# Patient Record
Sex: Male | Born: 1965 | Race: White | Hispanic: No | Marital: Single | Smoking: Current every day smoker
Health system: Southern US, Community
[De-identification: ages and names within clinical notes are randomized; demographics above are authoritative.]

## PROBLEM LIST (undated history)

## (undated) HISTORY — PX: OTHER SURGICAL HISTORY: SHX169

---

## 2007-09-15 ENCOUNTER — Emergency Department: Payer: Self-pay | Admitting: Emergency Medicine

## 2009-02-10 ENCOUNTER — Emergency Department: Payer: Self-pay

## 2014-01-05 ENCOUNTER — Emergency Department: Payer: Self-pay | Admitting: Emergency Medicine

## 2014-03-19 ENCOUNTER — Observation Stay: Payer: Self-pay | Admitting: Surgery

## 2014-03-19 LAB — COMPREHENSIVE METABOLIC PANEL
ALBUMIN: 2.9 g/dL — AB (ref 3.4–5.0)
AST: 40 U/L — AB (ref 15–37)
Alkaline Phosphatase: 132 U/L — ABNORMAL HIGH
Anion Gap: 8 (ref 7–16)
BILIRUBIN TOTAL: 0.4 mg/dL (ref 0.2–1.0)
BUN: 7 mg/dL (ref 7–18)
CO2: 24 mmol/L (ref 21–32)
CREATININE: 0.78 mg/dL (ref 0.60–1.30)
Calcium, Total: 8.3 mg/dL — ABNORMAL LOW (ref 8.5–10.1)
Chloride: 102 mmol/L (ref 98–107)
EGFR (African American): >60
Glucose: 136 mg/dL — ABNORMAL HIGH (ref 65–99)
Osmolality: 268 (ref 275–301)
Potassium: 3.8 mmol/L (ref 3.5–5.1)
SGPT (ALT): 33 U/L (ref 12–78)
Sodium: 134 mmol/L — ABNORMAL LOW (ref 136–145)
Total Protein: 7 g/dL (ref 6.4–8.2)

## 2014-03-19 LAB — URINALYSIS, COMPLETE
BACTERIA: NONE SEEN
BILIRUBIN, UR: NEGATIVE
Blood: NEGATIVE
GLUCOSE, UR: NEGATIVE mg/dL (ref 0–75)
KETONE: NEGATIVE
LEUKOCYTE ESTERASE: NEGATIVE
Nitrite: NEGATIVE
Ph: 6 (ref 4.5–8.0)
Protein: NEGATIVE
RBC,UR: 2 /HPF (ref 0–5)
Specific Gravity: 1.01 (ref 1.003–1.030)
Squamous Epithelial: <1
WBC UR: 2 /HPF (ref 0–5)

## 2014-03-19 LAB — TROPONIN I: Troponin-I: <0.02 ng/mL

## 2014-03-19 LAB — CBC
HCT: 46.7 % (ref 40.0–52.0)
HGB: 15.6 g/dL (ref 13.0–18.0)
MCH: 33.1 pg (ref 26.0–34.0)
MCHC: 33.3 g/dL (ref 32.0–36.0)
MCV: 99 fL (ref 80–100)
Platelet: 184 10*3/uL (ref 150–440)
RBC: 4.7 10*6/uL (ref 4.40–5.90)
RDW: 13 % (ref 11.5–14.5)
WBC: 15.3 10*3/uL — AB (ref 3.8–10.6)

## 2014-03-19 LAB — CK TOTAL AND CKMB (NOT AT ARMC): CK, Total: 262 U/L

## 2014-03-21 ENCOUNTER — Emergency Department: Payer: Self-pay | Admitting: Emergency Medicine

## 2014-03-21 LAB — CBC WITH DIFFERENTIAL/PLATELET
Basophil #: 0.1 10*3/uL (ref 0.0–0.1)
Basophil %: 0.7 %
Eosinophil #: 0.3 10*3/uL (ref 0.0–0.7)
Eosinophil %: 3.2 %
HCT: 42.2 % (ref 40.0–52.0)
HGB: 14.3 g/dL (ref 13.0–18.0)
Lymphocyte #: 1 10*3/uL (ref 1.0–3.6)
Lymphocyte %: 12.3 %
MCH: 32.9 pg (ref 26.0–34.0)
MCHC: 33.8 g/dL (ref 32.0–36.0)
MCV: 98 fL (ref 80–100)
Monocyte #: 0.8 x10 3/mm (ref 0.2–1.0)
Monocyte %: 10.1 %
Neutrophil #: 6 10*3/uL (ref 1.4–6.5)
Neutrophil %: 73.7 %
Platelet: 166 10*3/uL (ref 150–440)
RBC: 4.33 10*6/uL — ABNORMAL LOW (ref 4.40–5.90)
RDW: 12.9 % (ref 11.5–14.5)
WBC: 8.2 10*3/uL (ref 3.8–10.6)

## 2014-03-21 LAB — COMPREHENSIVE METABOLIC PANEL
ALBUMIN: 2.8 g/dL — AB (ref 3.4–5.0)
ALK PHOS: 150 U/L — AB
ALT: 72 U/L (ref 12–78)
AST: 75 U/L — AB (ref 15–37)
Anion Gap: 7 (ref 7–16)
BUN: 8 mg/dL (ref 7–18)
Bilirubin,Total: 0.3 mg/dL (ref 0.2–1.0)
CO2: 27 mmol/L (ref 21–32)
Calcium, Total: 8.2 mg/dL — ABNORMAL LOW (ref 8.5–10.1)
Chloride: 101 mmol/L (ref 98–107)
Creatinine: 0.78 mg/dL (ref 0.60–1.30)
EGFR (African American): >60
EGFR (Non-African Amer.): >60
GLUCOSE: 114 mg/dL — AB (ref 65–99)
Osmolality: 269 (ref 275–301)
POTASSIUM: 3.2 mmol/L — AB (ref 3.5–5.1)
SODIUM: 135 mmol/L — AB (ref 136–145)
TOTAL PROTEIN: 6.6 g/dL (ref 6.4–8.2)

## 2014-03-21 LAB — TROPONIN I

## 2014-03-21 LAB — URINALYSIS, COMPLETE
Bacteria: NONE SEEN
Bilirubin,UR: NEGATIVE
Blood: NEGATIVE
Glucose,UR: NEGATIVE mg/dL (ref 0–75)
Ketone: NEGATIVE
Leukocyte Esterase: NEGATIVE
Nitrite: NEGATIVE
Ph: 7 (ref 4.5–8.0)
Protein: NEGATIVE
RBC,UR: 2 /HPF (ref 0–5)
Specific Gravity: 1.017 (ref 1.003–1.030)
Squamous Epithelial: <1
WBC UR: <1 /HPF (ref 0–5)

## 2014-03-21 LAB — LIPASE, BLOOD: Lipase: 167 U/L (ref 73–393)

## 2014-03-22 LAB — RAPID INFLUENZA A&B ANTIGENS

## 2014-03-23 LAB — URINE CULTURE

## 2014-03-26 LAB — CULTURE, BLOOD (SINGLE)

## 2014-12-05 ENCOUNTER — Emergency Department: Payer: Self-pay | Admitting: Emergency Medicine

## 2014-12-05 LAB — CBC
HCT: 50.1 % (ref 40.0–52.0)
HGB: 16.7 g/dL (ref 13.0–18.0)
MCH: 33.5 pg (ref 26.0–34.0)
MCHC: 33.4 g/dL (ref 32.0–36.0)
MCV: 100 fL (ref 80–100)
Platelet: 265 10*3/uL (ref 150–440)
RBC: 4.99 10*6/uL (ref 4.40–5.90)
RDW: 12.8 % (ref 11.5–14.5)
WBC: 10.1 10*3/uL (ref 3.8–10.6)

## 2014-12-05 LAB — BASIC METABOLIC PANEL
Anion Gap: 8 (ref 7–16)
BUN: 11 mg/dL (ref 7–18)
CHLORIDE: 105 mmol/L (ref 98–107)
Calcium, Total: 8.5 mg/dL (ref 8.5–10.1)
Co2: 27 mmol/L (ref 21–32)
Creatinine: 0.97 mg/dL (ref 0.60–1.30)
EGFR (African American): >60
EGFR (Non-African Amer.): >60
GLUCOSE: 110 mg/dL — AB (ref 65–99)
Osmolality: 279 (ref 275–301)
POTASSIUM: 4 mmol/L (ref 3.5–5.1)
Sodium: 140 mmol/L (ref 136–145)

## 2014-12-05 LAB — PROTIME-INR
INR: 0.9
PROTHROMBIN TIME: 12 s (ref 11.5–14.7)

## 2014-12-05 LAB — TROPONIN I

## 2014-12-06 LAB — TROPONIN I: Troponin-I: <0.02 ng/mL

## 2015-02-24 IMAGING — CR DG CHEST 2V
1 series · 2 of 2 positions shown · non-contrast
Comparison: DG CHEST 2V dated 03/19/2014

CLINICAL DATA: Abdominal pain.  Postoperative fever.

EXAM:
CHEST  2 VIEW

[Series 3: w chest pa · 0.14mm/px · 2 of 2 slices shown]
[im 1/2]
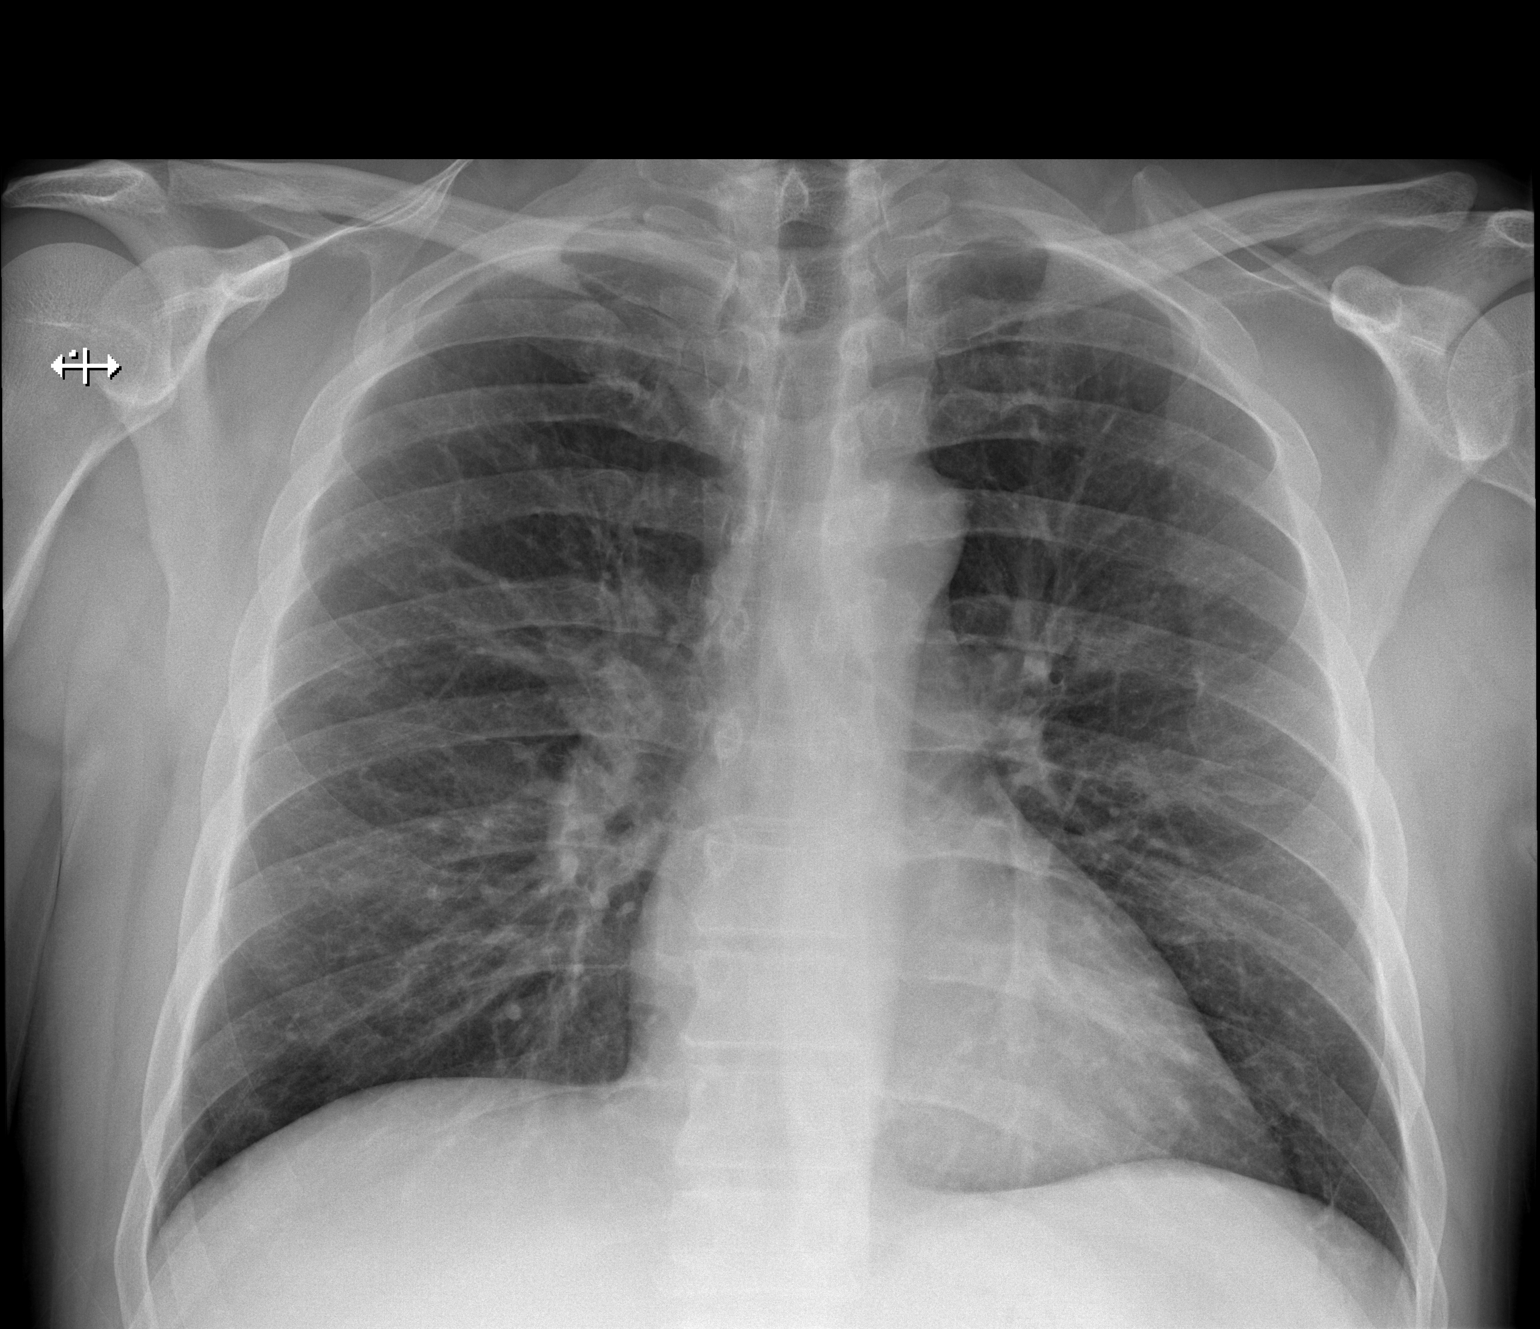
[im 2/2]
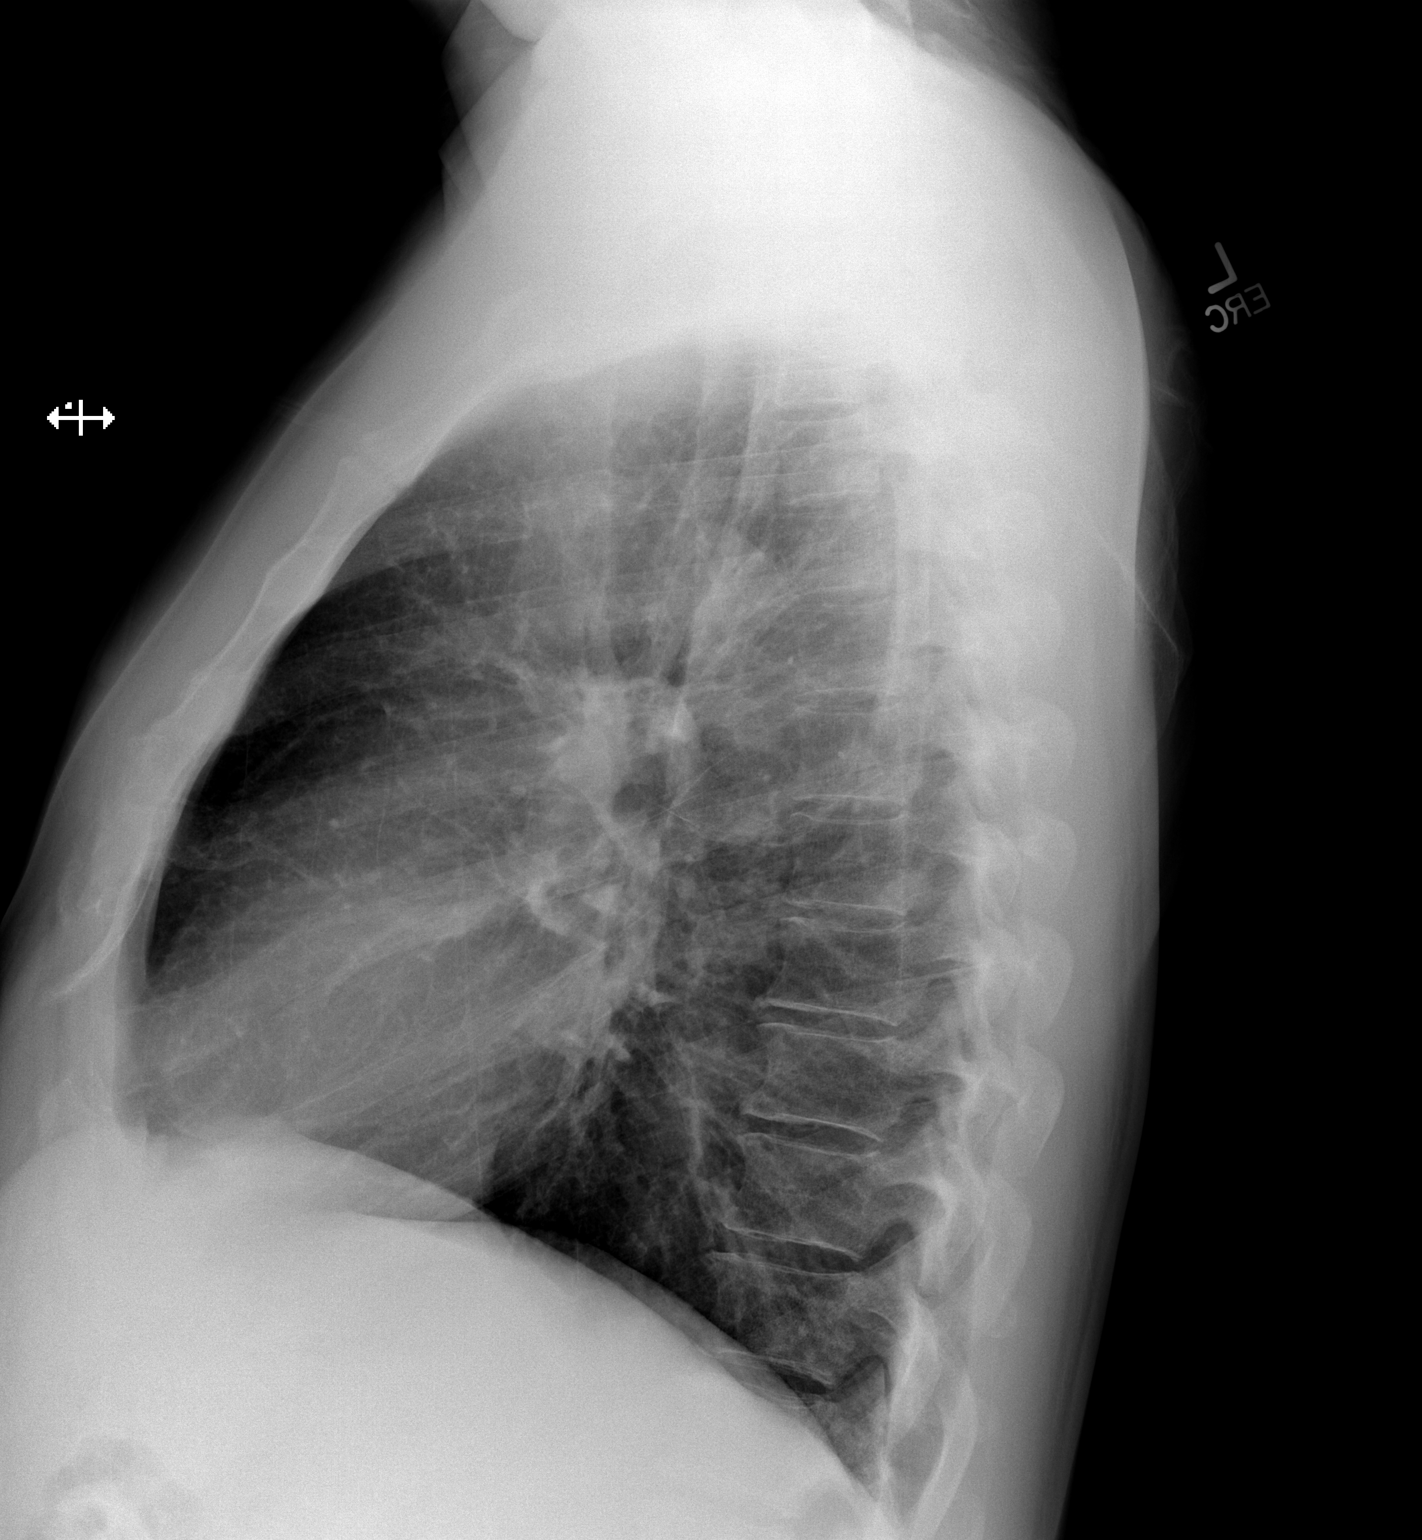

[2 of 2 positions shown; findings below may reference images not displayed]

FINDINGS: The heart size and mediastinal contours are within normal limits.
Both lungs are clear. The visualized skeletal structures are
unremarkable.
IMPRESSION: No active cardiopulmonary disease.

## 2015-03-26 NOTE — H&P (Signed)
History of Present Illness 49 yom who began experiencing anal pain 5 days ago, which is associated with constipation and is worsening. He now believes he needs to have a BM. He denies fever, and any previous similar episodes. He has not had any spontaneous drainage. Last meal was 4AM this AM. He has not been able to work this week.   Past Med/Surgical Hx:  Denies medical history:   ALLERGIES:  No Known Allergies:   HOME MEDICATIONS: Medication Instructions Status  Proctofoam 1% rectal foam  rectal 3 times a day Active   Family and Social History:  Family History Non-Contributory   Social History positive  tobacco, positive ETOH, lives with fiancee, her 63 and 71 yo children; works as a Development worker, international [employer], smokes 1 PPD, drinks moderate amount of ETOH (mostly beer), without clear binging history   Place of Living Home   Review of Systems:  Fever/Chills No   Cough No   Sputum No   Abdominal Pain No   Diarrhea No   Constipation Yes   Nausea/Vomiting No   SOB/DOE No   Chest Pain No   Dysuria No   Tolerating PT Yes   Tolerating Diet Yes   Medications/Allergies Reviewed Medications/Allergies reviewed   Physical Exam:  GEN well developed, well nourished, no acute distress   HEENT pink conjunctivae, PERRL, hearing intact to voice, dry oral mucosa, Oropharynx clear   NECK supple  No masses  trachea midline   RESP normal resp effort  clear BS  no use of accessory muscles   CARD regular rate  no murmur  No LE edema  no JVD  no Rub   ABD denies tenderness  soft  normal BS   GU posterior midline perianal redness, edema; much closer to anus than Pilonidal disease would be   LYMPH negative neck   EXTR negative cyanosis/clubbing, negative edema   SKIN normal to palpation, see above for rectal skin   NEURO cranial nerves intact, negative tremor, follows commands, motor/sensory function intact   PSYCH alert, A+O to time, place, person, good insight   Lab  Results: Hepatic:  17-Apr-15 14:32   Bilirubin, Total 0.4  Alkaline Phosphatase  132 (45-117 NOTE: New Reference Range 10/23/13)  SGPT (ALT) 33  SGOT (AST)  40  Total Protein, Serum 7.0  Albumin, Serum  2.9  Routine Chem:  17-Apr-15 14:32   Glucose, Serum  136  BUN 7  Creatinine (comp) 0.78  Sodium, Serum  134  Potassium, Serum 3.8  Chloride, Serum 102  CO2, Serum 24  Calcium (Total), Serum  8.3  Osmolality (calc) 268  eGFR (African American) >60  eGFR (Non-African American) >60 (eGFR values <9m/min/1.73 m2 may be an indication of chronic kidney disease (CKD). Calculated eGFR is useful in patients with stable renal function. The eGFR calculation will not be reliable in acutely ill patients when serum creatinine is changing rapidly. It is not useful in  patients on dialysis. The eGFR calculation may not be applicable to patients at the low and high extremes of body sizes, pregnant women, and vegetarians.)  Anion Gap 8  Cardiac:  17-Apr-15 14:32   CK, Total 262 (39-308 NOTE: NEW REFERENCE RANGE  01/04/2014)  CPK-MB, Serum  < 0.5 (Result(s) reported on 19 Mar 2014 at 03:23PM.)  Troponin I < 0.02 (0.00-0.05 0.05 ng/mL or less: NEGATIVE  Repeat testing in 3-6 hrs  if clinically indicated. >0.05 ng/mL: POTENTIAL  MYOCARDIAL INJURY. Repeat  testing in 3-6 hrs if  clinically indicated. NOTE:  An increase or decrease  of 30% or more on serial  testing suggests a  clinically important change)  Routine Hem:  17-Apr-15 14:32   WBC (CBC)  15.3  RBC (CBC) 4.70  Hemoglobin (CBC) 15.6  Hematocrit (CBC) 46.7  Platelet Count (CBC) 184 (Result(s) reported on 19 Mar 2014 at 03:14PM.)  MCV 99  MCH 33.1  MCHC 33.3  RDW 13.0   Radiology Results: LabUnknown:    17-Apr-15 16:37, CT Pelvis With Contrast  PACS Image  CT:  CT Pelvis With Contrast  REASON FOR EXAM:    eval rectal abscess. red, tender perineum;    NOTE:   Nursing to Give Oral CT Cont  COMMENTS:        PROCEDURE: CT  - CT PELVIS STANDARD W  - Mar 19 2014  4:37PM     CLINICAL DATA:  Clinical findings that may indicate a rectal abscess    EXAM:  CT PELVIS WITH CONTRAST    TECHNIQUE:  Multidetector CT imaging of the pelvis was performed using the  standard protocol following the bolus administration of intravenous  contrast.  CONTRAST:  100 cc of Isovue 370 intravenously. The patient also  received oral contrast material.    COMPARISON:  COMPARISON    None.    FINDINGS:  The visualized portions of the small and large bowel including the  appendix appear normal. The rectosigmoid is normal but there is a  small perirectal fluidcollection posteriorly demonstrated on images  50 through 58. This fluid collection measures 2.5 cm transversely x  2.6 cm AP x 2.8 cm longitudinally. It appears well marginated. No  significant inflammatory change in the ischiorectal fat is  demonstrated.  There is no free fluid or free air within the pelvis. There is no  pelvic sidewall lymphadenopathy. The urinary bladder, prostate  gland, and seminal vesicles appear normal. There is no inguinal  hernia. No significant inguinal lymphadenopathyis demonstrated. The  bony pelvis and the hips exhibit no acute abnormalities.     IMPRESSION:  1. There is a posterior perirectal/perianal fluid collection  consistent with the clinically suspected abscess. More proximally  the rectosigmoid appears normal.  2. There is no free fluid or free air within the pelvis nor in the  perirectal soft tissues.  3. There is no pelvic or inguinal lymphadenopathy.    Electronically Signed    By: David  Martinique    On: 03/19/2014 16:44         Verified By: DAVID A. Martinique, M.D., MD    Assessment/Admission Diagnosis Posterior midline perirectal abscess   Plan Admit, IVF, ABx EUA and drainage Pt understands and agrees. He is aware that his surgery may be this evening or may be in AM, by Dr Rexene Edison, or me,  respectively.   Electronic Signatures: Consuela Mimes (MD)  (Signed 17-Apr-15 18:07)  Authored: CHIEF COMPLAINT and HISTORY, PAST MEDICAL/SURGIAL HISTORY, ALLERGIES, HOME MEDICATIONS, FAMILY AND SOCIAL HISTORY, REVIEW OF SYSTEMS, PHYSICAL EXAM, LABS, Radiology, ASSESSMENT AND PLAN   Last Updated: 17-Apr-15 18:07 by Consuela Mimes (MD)

## 2015-03-26 NOTE — H&P (Signed)
   Subjective/Chief Complaint Fever to 102, malaise, diffuse aches   History of Present Illness Joel Mendez is a pleasant healthy 49 yo M who recently underwent incision and drainage of a perianal abscess 3 days ago who presented after documenting a temperature of 102 F last evening.  He said that he was doing well since his drainage procedure except that throughout the day he began to feel "funny".  Lost appetite throughout the day and said that the food he had tried to eat tonight tasted terrible.  Also with some loose stools at time of procedure and after drinking IV contrast.  No specific abdominal pain but ER MD could elicit pain in LUQ and RLQ with palpation earlier.  Generalized malaise and aches.  No sick contacts, no unusual ingestions.  Wonders if augmentin could be causing his issues.   Past History H/o I and D of perianal abscess on 4/17   Past Med/Surgical Hx:  Denies medical history:   ALLERGIES:  No Known Allergies:   Family and Social History:  Family History Non-Contributory   Social History positive  tobacco, negative ETOH, 1 ppd Tobacco, social EtOH   + Tobacco Current (within 1 year)   Place of Living Home   Review of Systems:  Subjective/Chief Complaint T 102, malaise, aches, anorexia   Fever/Chills Yes   Cough No   Sputum No   Abdominal Pain No   Diarrhea Yes   Constipation No   Nausea/Vomiting No   SOB/DOE No   Chest Pain No   Dysuria No   Tolerating PT No   Tolerating Diet No  Anorexia   Physical Exam:  GEN well developed, well nourished, no acute distress   HEENT pink conjunctivae, PERRL, hearing intact to voice   RESP normal resp effort  clear BS  no use of accessory muscles   CARD regular rate  no murmur  no thrills   ABD denies tenderness  denies Flank Tenderness  no hernia  soft  normal BS  No perianal swelling/erythema, penrose in place   LYMPH negative neck, negative axillae   SKIN normal to palpation, No rashes, No ulcers    NEURO cranial nerves intact, negative rigidity, negative tremor, follows commands, motor/sensory function intact   PSYCH A+O to time, place, person, good insight    Assessment/Admission Diagnosis Joel Mendez is a pleasant 49 yo M s/p I and D of anal abscess, presenting with malaise, anorexia, undocumented fevers.  I and D sitet unremarkable, ct unremarkable.  WBC nl   Plan Joel Mendez is a pleasant 49 yo M with anorexia, malaise, myalgias and no fevers while here.  Favor unspecified viral illness.  No acute surgical needs.  F/u as outpatient for penrose removal in approx 1 week.   Electronic Signatures: Jarvis NewcomerLundquist, Rand Boller A (MD)  (Signed 20-Apr-15 02:37)  Authored: CHIEF COMPLAINT and HISTORY, PAST MEDICAL/SURGIAL HISTORY, ALLERGIES, FAMILY AND SOCIAL HISTORY, REVIEW OF SYSTEMS, PHYSICAL EXAM, ASSESSMENT AND PLAN   Last Updated: 20-Apr-15 02:37 by Jarvis NewcomerLundquist, Arrie Borrelli A (MD)

## 2015-03-26 NOTE — Op Note (Signed)
PATIENT NAME:  Joel Mendez, Joel Mendez MR#:  [id number] DATE OF BIRTH:  27-Apr-1966  DATE OF PROCEDURE:  03/19/2014  PREOPERATIVE DIAGNOSIS:  Perianal abscess.  POSTOPERATIVE DIAGNOSIS:  Perianal abscess.  PROCEDURES PERFORMED: 1.  Rectal exam under anesthesia.  2.  Incision and drainage of perianal abscess, right posterior.   ANESTHESIA:  General LMA.  ESTIMATED BLOOD LOSS:  10 mL.   COMPLICATIONS:  None.   SPECIMENS:  None.   INDICATIONS FOR PROCEDURE:  Mr. Joel Mendez is a pleasant 49 year old male that presents with three day history of perianal pain and CT scan concerning for a perianal abscess.  He was brought to the Operating Room for incision and drainage of perianal abscess.   DETAILS OF PROCEDURE IS AS FOLLOWS:  Informed consent was obtained.  Mr. Joel Mendez was then brought to the operating room suite.  He was induced, LMA was placed.  General anesthesia was administered.  His anus was prepped and draped in a standard surgical fashion.  A timeout was then performed correctly identifying the patient's name, operative site and procedure to be performed.  His legs were placed in stirrups.  His anus was prepped and draped. A timeout was then performed correctly identifying the patient's name, operative site and procedure to be performed.  His anus was then infiltrated with 1% lidocaine with epinephrine to relax the sphincters and provide perianal anesthesia.  A Hill-ferguson retractor was placed in the anus.  There were three column inflamed internal/external hemorrhoids as well as a palpable right posterior abscess.  An incision was made at the most posterior aspect of the abscess.  It was purulent.  The abscess cavity was probed.  It appeared to go parallel and is deep as well as anterior.  A counterincision was placed anterior.  A 1/4 inch penrose drain  was then placed in the cavity and sutured it with both incisions.  The cavity was then irrigated and packed with quarter-inch iodoform gauze.  A sterile  dressing was then placed over the wound.  The drapes were taken down.  The patient was then awoken, LMA removed and brought to the postanesthesia care unit.  There were no immediate complications.  Needle, sponge, and instrument counts correct at the end of the procedure.    ____________________________ Si Raiderhristopher A. Rhealyn Cullen, MD cal:ea D: 03/19/2014 20:59:03 ET T: 03/20/2014 04:29:19 ET JOB#: [id number]  cc: Cristal Deerhristopher A. Raider Valbuena, MD, <Dictator> Jarvis NewcomerHRISTOPHER A Jury Caserta MD ELECTRONICALLY SIGNED 03/22/2014 1:51

## 2017-04-25 ENCOUNTER — Emergency Department
Admission: EM | Admit: 2017-04-25 | Discharge: 2017-04-25 | Disposition: A | Discharge status: Home / Self Care | Payer: BLUE CROSS/BLUE SHIELD | Attending: Student in an Organized Health Care Education/Training Program | Admitting: Student in an Organized Health Care Education/Training Program

## 2017-04-25 ENCOUNTER — Encounter: Payer: Self-pay | Admitting: Emergency Medicine

## 2017-04-25 DIAGNOSIS — Y929 Unspecified place or not applicable: Secondary | ICD-10-CM | POA: Diagnosis not present

## 2017-04-25 DIAGNOSIS — M5412 Radiculopathy, cervical region: Secondary | ICD-10-CM

## 2017-04-25 DIAGNOSIS — F17200 Nicotine dependence, unspecified, uncomplicated: Secondary | ICD-10-CM | POA: Diagnosis not present

## 2017-04-25 DIAGNOSIS — S43421A Sprain of right rotator cuff capsule, initial encounter: Secondary | ICD-10-CM

## 2017-04-25 DIAGNOSIS — Y9389 Activity, other specified: Secondary | ICD-10-CM | POA: Insufficient documentation

## 2017-04-25 DIAGNOSIS — Y998 Other external cause status: Secondary | ICD-10-CM | POA: Insufficient documentation

## 2017-04-25 DIAGNOSIS — X58XXXA Exposure to other specified factors, initial encounter: Secondary | ICD-10-CM | POA: Insufficient documentation

## 2017-04-25 DIAGNOSIS — M25511 Pain in right shoulder: Secondary | ICD-10-CM | POA: Diagnosis present

## 2017-04-25 MED ORDER — HYDROCODONE-ACETAMINOPHEN 5-325 MG PO TABS: 1.0000 | ORAL_TABLET | ORAL | 0 refills | Status: DC | PRN

## 2017-04-25 MED ORDER — MELOXICAM 15 MG PO TABS: 15.0000 mg | ORAL_TABLET | Freq: Every day | ORAL | 0 refills | Status: DC

## 2017-04-25 MED ORDER — PREDNISONE 10 MG (21) PO TBPK: ORAL_TABLET | ORAL | 0 refills | Status: DC

## 2017-04-25 NOTE — ED Provider Notes (Signed)
[facility] Emergency Department Provider Note ____________________________________________  Time seen: Approximately 9:19 AM  I have reviewed the triage vital signs and the nursing notes.   HISTORY  Chief Complaint Shoulder Pain    HPI Criss Pallone is a 51 y.o. male who presents to the emergency department for evaluation of pain to the right shoulder. He's had some similar pain in the past that he says is a "pinched nerve." He awakened a few days ago with pain that seems to get much worse while driving. Shoulder got much worse yesterday after swinging a sickle while at work.  History reviewed. No pertinent past medical history.  There are no active problems to display for this patient.   History reviewed. No pertinent surgical history.  Prior to Admission medications   Medication Sig Start Date End Date Taking? Authorizing Provider  HYDROcodone-acetaminophen (NORCO/VICODIN) 5-325 MG tablet Take 1 tablet by mouth every 4 (four) hours as needed for moderate pain. 04/25/17 04/25/18  Cyndee Giammarco, Rulon Eisenmenger B, FNP  meloxicam (MOBIC) 15 MG tablet Take 1 tablet (15 mg total) by mouth daily. Start after finishing prednisone pack 04/25/17   Janae Bonser B, FNP  predniSONE (STERAPRED UNI-PAK 21 TAB) 10 MG (21) TBPK tablet Take 6 tablets on day 1 Take 5 tablets on day 2 Take 4 tablets on day 3 Take 3 tablets on day 4 Take 2 tablets on day 5 Take 1 tablet on day 6 04/25/17   Kem Boroughs B, FNP    Allergies Patient has no known allergies.  No family history on file.  Social History Social History  Substance Use Topics  . Smoking status: Current Some Day Smoker  . Smokeless tobacco: Never Used  . Alcohol use Yes    Review of Systems Constitutional: Well appearing Cardiovascular: No active bleeding Respiratory: No cough or shortness of breath Musculoskeletal: Positive for pain in the right-sided neck and shoulder Skin: No rash, lesion, wound  Neurological:  No altered sensation or paresthesias  ____________________________________________   PHYSICAL EXAM:  VITAL SIGNS: ED Triage Vitals  Enc Vitals Group     BP 04/25/17 0850 (!) 160/99     Pulse Rate 04/25/17 0850 99     Resp 04/25/17 0850 18     Temp 04/25/17 0850 98.6 F (37 C)     Temp Source 04/25/17 0850 Oral     SpO2 04/25/17 0850 98 %     Weight 04/25/17 0848 205 lb (93 kg)     Height 04/25/17 0848 6' (1.829 m)     Head Circumference --      Peak Flow --      Pain Score 04/25/17 0848 7     Pain Loc --      Pain Edu? --      Excl. in GC? --     Constitutional: Alert and oriented. Well appearing and in no acute distress. Eyes: Conjunctivae are clear without drainage  Head: Atraumatic Neck: Tender to palpation along the sternocleidomastoid muscle on the right side.  Respiratory: Respirations even and unlabored Musculoskeletal: Tender to palpation and with range of motion over the anterior portion of the shoulder with pain radiating into the right axilla. Neurologic: Alert and oriented. GCS 15. Equal grip strength.  Skin: No rash, lesion, or wound noted on exposed skin surface.  Psychiatric: Normal behavior. Normal affect.  ____________________________________________   LABS (all labs ordered are listed, but only abnormal results are displayed)  Labs Reviewed - No data to display ____________________________________________  RADIOLOGY  Not indicated ____________________________________________   PROCEDURES  Procedure(s) performed: None  ____________________________________________   INITIAL IMPRESSION / ASSESSMENT AND PLAN / ED COURSE  Joel FischerWinston Winski is a 51 y.o. male who presents to the emergency department for evaluation of right neck and shoulder pain. Pain it in his neck is acute on chronic. He states that he had to swing a sickle repeatedly yesterday while at work.   Today he will receive prescriptions for prednisone and norco. He will also be given a  prescription for meloxicam to start after the prednisone. He is to follow up with orthopedics.  Pertinent labs & imaging results that were available during my care of the patient were reviewed by me and considered in my medical decision making (see chart for details).  _________________________________________   FINAL CLINICAL IMPRESSION(S) / ED DIAGNOSES  Final diagnoses:  Cervical radiculopathy  Sprain of right rotator cuff capsule, initial encounter    New Prescriptions   HYDROCODONE-ACETAMINOPHEN (NORCO/VICODIN) 5-325 MG TABLET    Take 1 tablet by mouth every 4 (four) hours as needed for moderate pain.   MELOXICAM (MOBIC) 15 MG TABLET    Take 1 tablet (15 mg total) by mouth daily. Start after finishing prednisone pack   PREDNISONE (STERAPRED UNI-PAK 21 TAB) 10 MG (21) TBPK TABLET    Take 6 tablets on day 1 Take 5 tablets on day 2 Take 4 tablets on day 3 Take 3 tablets on day 4 Take 2 tablets on day 5 Take 1 tablet on day 6    If controlled substance prescribed during this visit, 12 month history viewed on the [facility] prior to issuing an initial prescription for Schedule II or III opiod.    Chinita Pesterriplett, Shaheim Mahar B, FNP 04/25/17 [id number]    Willy Eddyobinson, Patrick, MD 04/26/17 0430

## 2017-04-25 NOTE — Discharge Instructions (Signed)
Follow up with the primary care provider or orthopedics for symptoms that are not improving over the week. Return to the ER for symptoms that change or worsen if unable to schedule an appointment.

## 2017-04-25 NOTE — ED Notes (Signed)
Patient reports pain to right shoulder and neck. Reports history of pinched nerve with similar symptoms. 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 Full range of motion noted.

## 2017-04-25 NOTE — ED Triage Notes (Signed)
Right shoulder pain since Monday, hx of same with pinched nerve.

## 2017-04-30 ENCOUNTER — Ambulatory Visit
Admission: EM | Admit: 2017-04-30 | Discharge: 2017-04-30 | Disposition: A | Discharge status: Home / Self Care | Payer: BLUE CROSS/BLUE SHIELD | Attending: Family Medicine | Admitting: Family Medicine

## 2017-04-30 ENCOUNTER — Encounter: Payer: Self-pay | Admitting: Gynecology

## 2017-04-30 ENCOUNTER — Ambulatory Visit (INDEPENDENT_AMBULATORY_CARE_PROVIDER_SITE_OTHER): Payer: BLUE CROSS/BLUE SHIELD

## 2017-04-30 DIAGNOSIS — M25511 Pain in right shoulder: Secondary | ICD-10-CM

## 2017-04-30 DIAGNOSIS — S46001A Unspecified injury of muscle(s) and tendon(s) of the rotator cuff of right shoulder, initial encounter: Secondary | ICD-10-CM

## 2017-04-30 DIAGNOSIS — M5412 Radiculopathy, cervical region: Secondary | ICD-10-CM | POA: Diagnosis not present

## 2017-04-30 MED ORDER — KETOROLAC TROMETHAMINE 60 MG/2ML IM SOLN
60.0000 mg | Freq: Once | INTRAMUSCULAR | Status: AC
  Administered 2017-04-30: 60 mg via INTRAMUSCULAR

## 2017-04-30 MED ORDER — OXYCODONE-ACETAMINOPHEN 5-325 MG PO TABS: 1.0000 | ORAL_TABLET | Freq: Three times a day (TID) | ORAL | 0 refills | Status: DC | PRN

## 2017-04-30 MED ORDER — ORPHENADRINE CITRATE ER 100 MG PO TB12: 100.0000 mg | ORAL_TABLET | Freq: Two times a day (BID) | ORAL | 0 refills | Status: AC

## 2017-04-30 NOTE — ED Provider Notes (Signed)
[facility]    CSN: [id number] Arrival date & time: 04/30/17  1451     History   Chief Complaint Chief Complaint  Patient presents with  . Shoulder Pain    HPI Joel Mendez is a 51 y.o. male.   Patient's here because of pain in the right shoulder. Apparently he's had a history of recurrent radiculopathy and pain in his right neck and shoulder that goes down his right arm. Last week Wednesday at work he felt that he overused the right shoulder traumatic compensate for this pain and was having in his neck that started about 2 weeks ago he injured his rotator cuff. He was seen in the emergency room on Thursday. That time he was given about 12 Norco tablets prednisone orally and a prescription for Mobic and instructed to take Mobic once she finished prednisone and he has a believe tomorrow Silastic and the prednisone. He reports that he's taken essentially all the Vicodin which is the fifth day today. He was given by the directions 3 days worth of Vicodin. Today is the fifth day. 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 He states that the rotator cuff injury exact C better has improved post a pinched nerve in the neck is giving him a fitness he puts it interfering with his ability to sleep at night. That has gotten a lot worse. Before when his had his aggravation of the pinched nerve he see a chiropractor Dr. Jeannetta Ellis, and with manipulation a better. This time but rather he states that he has not seen Dr. Phineas Douglas in a while he lost his health insurance 2008 he now has his health insurance but has not seen anyone in a while. Explained to him Norco standards have a primary care doctor to prescribe narcotics continuously especially since he was seen in the ED 5 days ago. Also concern is he needs to reestablish the chiropractor if they were able to treat his pain and  discomfort without narcotics. Because of both shoulder and the neck hurting him we'll x-ray the neck shoulder specialist thinks not Better as far as the neck pain is concerned. We'll have him follow-up with PCP of his choice in 1-2 weeks or with the chiropractor.Should be noted the patient does smoke. No previous surgeries or operation of an abscess he did not see medical problems no known drug allergies.    The history is provided by the patient. No language interpreter was used.  Shoulder Pain  Location:  Shoulder Injury: no   Pain details:    Quality:  Cramping   Radiates to:  R arm   Severity:  Moderate   Onset quality:  Sudden   Duration:  2 weeks   Timing:  Constant   Progression:  Worsening Handedness:  Right-handed Dislocation: no   Foreign body present:  No foreign bodies Tetanus status:  Unknown Prior injury to area:  No Relieved by:  Nothing Worsened by:  Movement Ineffective treatments:  Being still, immobilization and narcotics   History reviewed. No pertinent past medical history.  There are no active problems to display for this patient.   Past Surgical History:  Procedure Laterality Date  . cyst removal         Home Medications    Prior to Admission medications   Medication Sig Start Date End Date Taking? Authorizing Provider  meloxicam (MOBIC) 15 MG tablet Take 1 tablet (15 mg total) by mouth daily. Start after finishing prednisone pack 04/25/17  Yes Triplett, Cari B, FNP  HYDROcodone-acetaminophen (NORCO/VICODIN) 5-325 MG tablet Take 1 tablet by mouth every 4 (four) hours as needed for moderate pain. 04/25/17 04/25/18  Triplett, Rulon Eisenmenger B, FNP  orphenadrine (NORFLEX) 100 MG tablet Take 1 tablet (100 mg total) by mouth 2 (two) times daily. 04/30/17   Hassan Rowan, MD  oxyCODONE-acetaminophen (PERCOCET/ROXICET) 5-325 MG tablet Take 1 tablet by mouth every 8 (eight) hours as needed for severe pain. 04/30/17   Hassan Rowan, MD  predniSONE (STERAPRED UNI-PAK 21 TAB)  10 MG (21) TBPK tablet Take 6 tablets on day 1 Take 5 tablets on day 2 Take 4 tablets on day 3 Take 3 tablets on day 4 Take 2 tablets on day 5 Take 1 tablet on day 6 04/25/17   Chinita Pester, FNP    Family History No family history on file.  Social History Social History  Substance Use Topics  . Smoking status: Current Some Day Smoker  . Smokeless tobacco: Never Used  . Alcohol use Yes     Allergies   Patient has no known allergies.   Review of Systems Review of Systems  Musculoskeletal: Positive for arthralgias, myalgias and neck stiffness.  All other systems reviewed and are negative.    Physical Exam Triage Vital Signs ED Triage Vitals  Enc Vitals Group     BP 04/30/17 1553 (!) 152/101     Pulse Rate 04/30/17 1553 68     Resp 04/30/17 1553 16     Temp 04/30/17 1553 98 F (36.7 C)     Temp Source 04/30/17 1553 Oral     SpO2 04/30/17 1553 99 %     Weight --      Height --      Head Circumference --      Peak Flow --      Pain Score 04/30/17 1555 7     Pain Loc --      Pain Edu? --      Excl. in GC? --    No data found.   Updated Vital Signs BP (!) 152/101 (BP Location: Left Arm)   Pulse 68   Temp 98 F (36.7 C) (Oral)   Resp 16   SpO2 99%   Visual Acuity Right Eye Distance:   Left Eye Distance:   Bilateral Distance:    Right Eye Near:   Left Eye Near:    Bilateral Near:     Physical Exam  Constitutional: He is oriented to person, place, and time. He appears well-developed and well-nourished. No distress.  HENT:  Head: Normocephalic and atraumatic.  Eyes: Pupils are equal, round, and reactive to light.  Neck: Normal range of motion. Neck supple.  Pulmonary/Chest: Effort normal.  Musculoskeletal: He exhibits tenderness.       Right shoulder: He exhibits tenderness. He exhibits normal range of motion.       Cervical back: He exhibits tenderness, bony tenderness and spasm.       Back:       Arms: Patient has minimum tenderness over  the right rotator cuff area he is able to invert the arm and also raising his assistance now he does have tenderness over the right trapezius muscle over the shoulder aspect marked tenderness and reproduction of the pain going down his right arm  Neurological: He is alert and oriented to person, place, and time.  No cranial nerve deficit. Coordination normal.  Skin: Skin is warm. He is not diaphoretic.  Vitals reviewed.    UC Treatments / Results  Labs (all labs ordered are listed, but only abnormal results are displayed) Labs Reviewed - No data to display  EKG  EKG Interpretation None       Radiology No results found.  Procedures Procedures (including critical care time)  Medications Ordered in UC Medications  ketorolac (TORADOL) injection 60 mg (60 mg Intramuscular Given 04/30/17 1632)     Initial Impression / Assessment and Plan / UC Course  I have reviewed the triage vital signs and the nursing notes.  Pertinent labs & imaging results that were available during my care of the patient were reviewed by me and considered in my medical decision making (see chart for details).     Patient appears to have chronic or recurrent right muscle spasm cervical strain causing radiculopathy going down his right arm. Workup recommend he start Mobic that his previous doctor or provider gave him in the emergency room muscular thin Norflex muscle relaxer 100 mg twice a day and I will give him a total of 15 Percocet tablets one tablet 3 times a day. He continues to help him rest and sleep work note for today and tomorrow strongly suggest he contact his chiropractor and he seemed less chiropractor tomorrow and if needs be see orthopedic choice did explain to him the the we did look him up with no prodrome reporting site to make sure that he was not a chronic drug user everything looks okay right now. And because of the complexity of the situation x-ray of the right shoulder and the neck if  negative he'll be sent to go  Final Clinical Impressions(s) / UC Diagnoses   Final diagnoses:  Acute pain of right shoulder  Cervical radiculopathy  Rotator cuff injury, right, initial encounter    New Prescriptions New Prescriptions   ORPHENADRINE (NORFLEX) 100 MG TABLET    Take 1 tablet (100 mg total) by mouth 2 (two) times daily.   OXYCODONE-ACETAMINOPHEN (PERCOCET/ROXICET) 5-325 MG TABLET    Take 1 tablet by mouth every 8 (eight) hours as needed for severe pain.    Note: This dictation was prepared with Dragon dictation along with smaller phrase technology. Any transcriptional errors that result from this process are unintentional.   Hassan RowanWade, Tyshae Stair, MD 04/30/17 1656

## 2017-04-30 NOTE — ED Triage Notes (Signed)
Per patient was seen at the ER on 04/25/2017 for his right shoulder pain. Per patient shoulder pain not getting any better and unable to sleep.

## 2017-10-12 ENCOUNTER — Emergency Department
Admission: EM | Admit: 2017-10-12 | Discharge: 2017-10-12 | Disposition: A | Discharge status: Home / Self Care | Payer: Managed Care, Other (non HMO) | Attending: Emergency Medicine | Admitting: Emergency Medicine

## 2017-10-12 ENCOUNTER — Encounter: Payer: Self-pay | Admitting: Emergency Medicine

## 2017-10-12 ENCOUNTER — Other Ambulatory Visit: Payer: Self-pay

## 2017-10-12 DIAGNOSIS — Z79899 Other long term (current) drug therapy: Secondary | ICD-10-CM | POA: Insufficient documentation

## 2017-10-12 DIAGNOSIS — M5412 Radiculopathy, cervical region: Secondary | ICD-10-CM | POA: Diagnosis not present

## 2017-10-12 DIAGNOSIS — F17210 Nicotine dependence, cigarettes, uncomplicated: Secondary | ICD-10-CM | POA: Insufficient documentation

## 2017-10-12 DIAGNOSIS — R21 Rash and other nonspecific skin eruption: Secondary | ICD-10-CM | POA: Diagnosis present

## 2017-10-12 MED ORDER — PREGABALIN 25 MG PO CAPS: 25.0000 mg | ORAL_CAPSULE | Freq: Two times a day (BID) | ORAL | 0 refills | Status: AC

## 2017-10-12 MED ORDER — HYDROCODONE-ACETAMINOPHEN 5-325 MG PO TABS: 1.0000 | ORAL_TABLET | ORAL | 0 refills | Status: DC | PRN

## 2017-10-12 MED ORDER — MELOXICAM 15 MG PO TABS: 15.0000 mg | ORAL_TABLET | Freq: Every day | ORAL | 0 refills | Status: AC

## 2017-10-12 MED ORDER — PREDNISONE 10 MG PO TABS: 60.0000 mg | ORAL_TABLET | Freq: Every day | ORAL | 0 refills | Status: DC

## 2017-10-12 NOTE — ED Notes (Signed)
Pt to ed with c/o right shoulder pain x several days,  States first three fingers of right hand are numb and painful.  Pt also with noted scab to back of shoulder.

## 2017-10-12 NOTE — ED Triage Notes (Signed)
States began pain R shoulder and down R arm 2 weeks ag. Noted scabbed over small area of tiny scabs R posterior shoulder area and small scabbed over spots down arm. No new blister areas noted.

## 2017-10-12 NOTE — ED Provider Notes (Signed)
[facility] Emergency Department Provider Note  ____________________________________________  Time seen: Approximately 9:55 AM  I have reviewed the triage vital signs and the nursing notes.   HISTORY  Chief Complaint Rash   HPI Joel Mendez is a 51 y.o. male who presents to the emergency department for treatment of acute on chronic pain in his right subscapular area, right shoulder, and right arm. He has had similar symptoms several times in the past. He has been evaluated here in the ER as well as urgent care a couple of different times, but has not followed up with orthopedics. He states that has current symptoms started approximately a week ago and now he has numbness in his long finger, middle finger, and thumb of the right hand. He states that the pain in the right arm is more like a burning, traveling, crawling type pain. He has taken ibuprofen and laid on a heating pad without any relief. He reports the only medical issue he has is hypertension, takes no medications daily, and has no known drug allergies.  History reviewed. No pertinent past medical history.  There are no active problems to display for this patient.   Past Surgical History:  Procedure Laterality Date  . cyst removal      Prior to Admission medications   Medication Sig Start Date End Date Taking? Authorizing Provider  HYDROcodone-acetaminophen (NORCO/VICODIN) 5-325 MG tablet Take 1 tablet every 4 (four) hours as needed by mouth for moderate pain. 10/12/17 10/12/18  Gibril Mastro, Rulon Eisenmengerari B, FNP  meloxicam (MOBIC) 15 MG tablet Take 1 tablet (15 mg total) daily by mouth. Start after finishing prednisone pack 10/12/17   Corlis Angelica B, FNP  orphenadrine (NORFLEX) 100 MG tablet Take 1 tablet (100 mg total) by mouth 2 (two) times daily. 04/30/17   Hassan RowanWade, Eugene, MD  predniSONE (DELTASONE) 10 MG tablet Take 6 tablets (60 mg total) daily by mouth. 10/12/17   Ainara Eldridge B, FNP  pregabalin (LYRICA)  25 MG capsule Take 1 capsule (25 mg total) 2 (two) times daily by mouth. 10/12/17   Vinay Ertl B, FNP    Allergies Patient has no known allergies.  No family history on file.  Social History Social History   Tobacco Use  . Smoking status: Current Every Day Smoker    Packs/day: 1.00    Types: Cigarettes  . Smokeless tobacco: Never Used  Substance Use Topics  . Alcohol use: Yes  . Drug use: No    Review of Systems  Constitutional: Negative for fever. Respiratory: Negative for cough or shortness of breath.  Musculoskeletal: Positive for myalgias Skin: Positive for rash Neurological: Positive for numbness or paresthesias of right arm and hand. ____________________________________________   PHYSICAL EXAM:  VITAL SIGNS: ED Triage Vitals  Enc Vitals Group     BP 10/12/17 0859 (!) 186/110     Pulse Rate 10/12/17 0855 (!) 105     Resp 10/12/17 0855 18     Temp 10/12/17 0855 97.6 F (36.4 C)     Temp Source 10/12/17 0855 Oral     SpO2 10/12/17 0855 94 %     Weight 10/12/17 0856 215 lb (97.5 kg)     Height 10/12/17 0856 6' (1.829 m)     Head Circumference --      Peak Flow --      Pain Score 10/12/17 0855 8     Pain Loc --      Pain Edu? --      Excl. in  GC? --      Constitutional: Uncomfortable appearing. Eyes: Conjunctivae are clear without discharge or drainage. Nose: No rhinorrhea noted. Mouth/Throat: Airway is patent.  Neck: No stridor. Unrestricted range of motion observed.  Cardiovascular: Capillary refill is <3 seconds.  Respiratory: Respirations are even and unlabored.. Musculoskeletal: Shoulder abduction release test of the right side is positive. Neurologic: Awake, alert, and oriented x 4. Decreased sensation to sharp and dull on exam of the right long, index, and thumb. No weakness appreciated in grip strength. No definite decrease in reflexes. Skin:  Excoriation is noted along the posterior aspect of the forearm and a single, scabbed lesion as  noted on the right subscapular area.  ____________________________________________   LABS (all labs ordered are listed, but only abnormal results are displayed)  Labs Reviewed - No data to display ____________________________________________  EKG  Not indicated ____________________________________________  RADIOLOGY  Not indicated. Images of the cervical spine and right shoulder taken during previous visits were reviewed. ____________________________________________   PROCEDURES  Procedure(s) performed: None ____________________________________________   INITIAL IMPRESSION / ASSESSMENT AND PLAN / ED COURSE  Joel Mendez is a 51 y.o. male who presents to the emergency department for treatment and evaluation of symptoms most consistent with cervical radiculopathy, likely C7. Patient has been treated multiple times for the same complaint. He was given detailed potential outcomes if he does not comply with recommended orthopedic follow-up. Patient states that he will be out of town for the next 3 weeks and will be unable to see anyone until after he returns home. He was encouraged to call on Tuesday to go ahead and schedule the appointment for when he returns. Patient agrees to do so. Today, he will be given prescriptions for prednisone, Lyrica, Norco, and meloxicam that is to be started only after he completes the prednisone. Patient was instructed to go to the nearest emergency department if symptoms change or worsen while he is out of town. He was also advised to see a PCP when possible for BP recheck and treatment of HTN if necessary.   Pertinent labs & imaging results that were available during my care of the patient were reviewed by me and considered in my medical decision making (see chart for details). ____________________________________________   FINAL CLINICAL IMPRESSION(S) / ED DIAGNOSES  Final diagnoses:  Cervical radiculopathy    If controlled substance  prescribed during this visit, 12 month history viewed on the [facility] prior to issuing an initial prescription for Schedule II or III opiod.   Note:  This document was prepared using Dragon voice recognition software and may include unintentional dictation errors.    Chinita Pesterriplett, Sarabelle Genson B, FNP 10/12/17 1136    Arnaldo NatalMalinda, Paul F, MD 10/12/17 [phone]

## 2017-10-12 NOTE — Discharge Instructions (Signed)
Call and schedule an appointment to see the orthopedic doctor.  Take the medications as prescribed.  Return to the ER for symptoms that change or worsen if unable to schedule an appointment.

## 2018-04-05 IMAGING — CR DG CERVICAL SPINE COMPLETE 4+V
8 series · 8 of 8 positions shown · non-contrast
Comparison: None.

CLINICAL DATA: Recent shoulder injury 2 weeks ago, now with
right-sided neck pain and stiffness.

EXAM:
CERVICAL SPINE - COMPLETE 4+ VIEW

[c-spine lat]
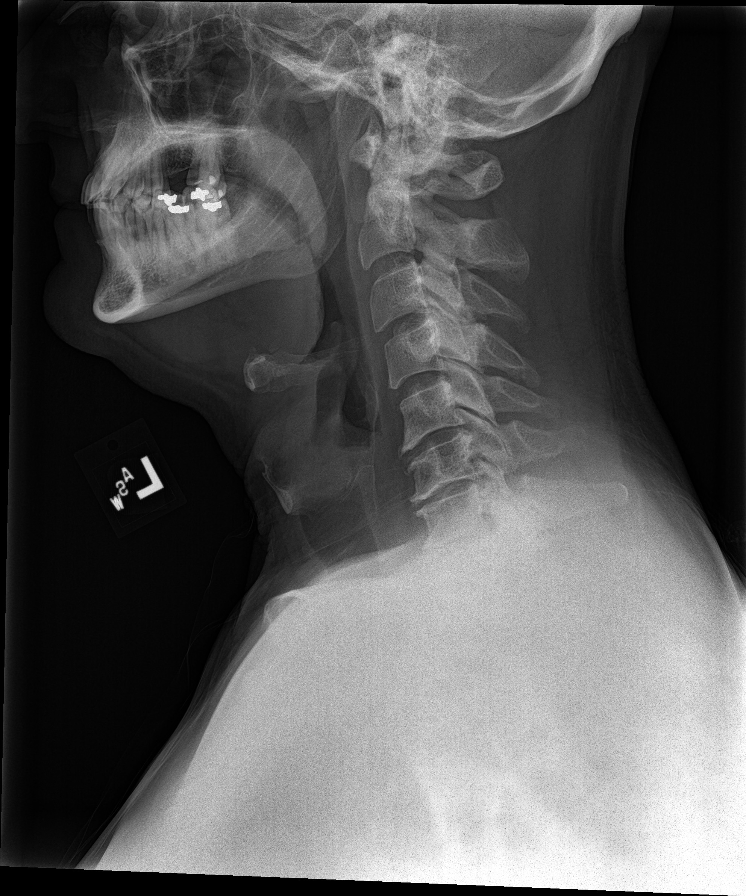

[c-spine obl (1 of 2)]
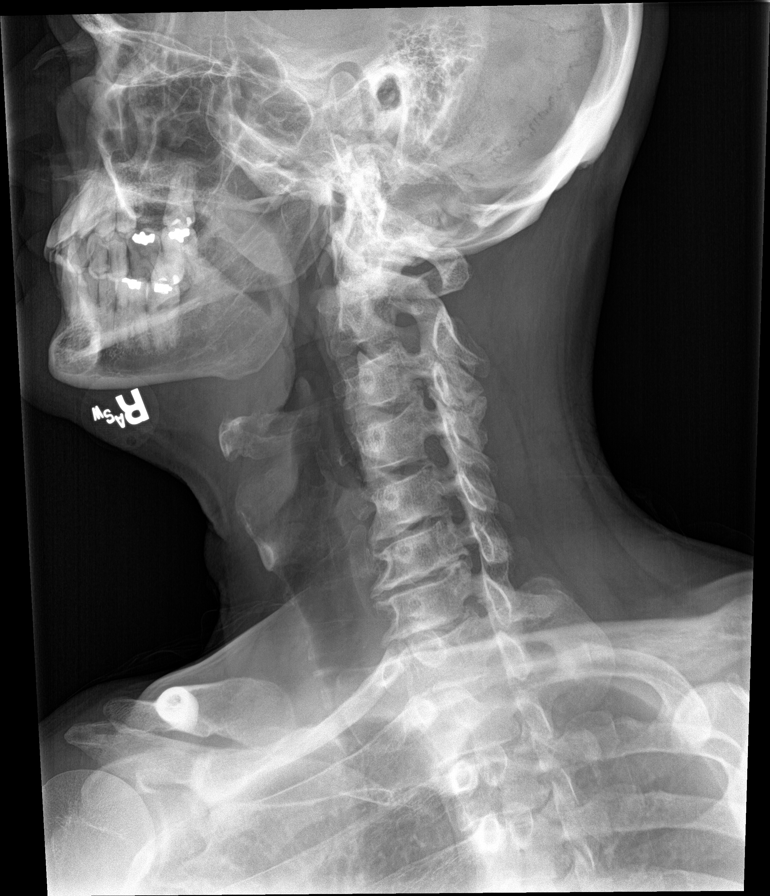

[c-spine obl (2 of 2)]
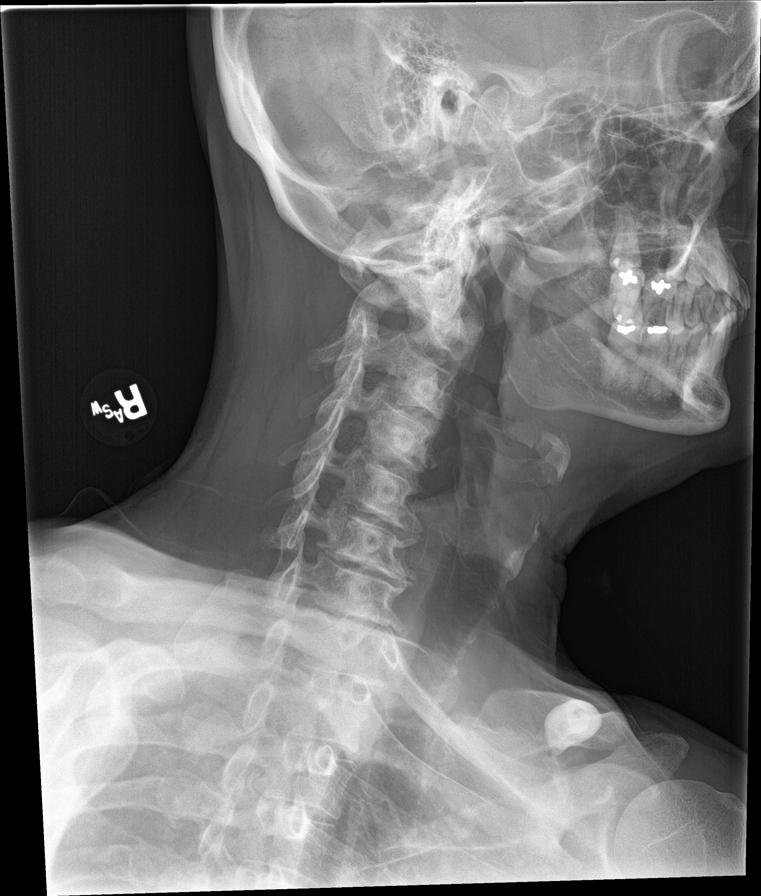

[c-spine ap]
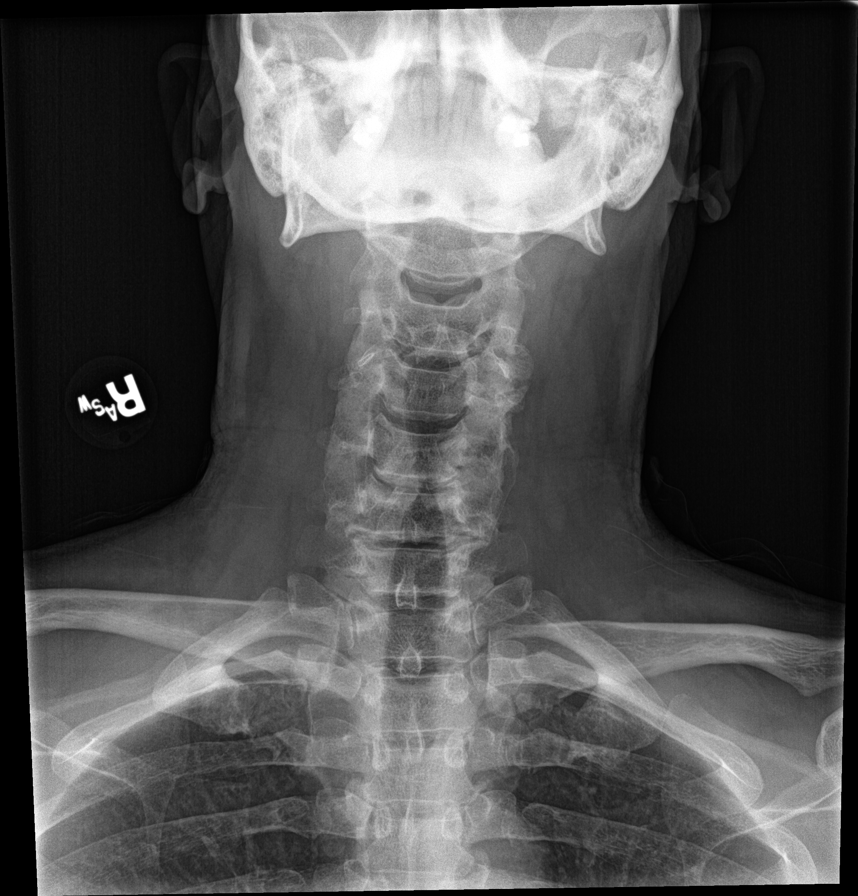

[c-spine open mouth (1 of 2)]
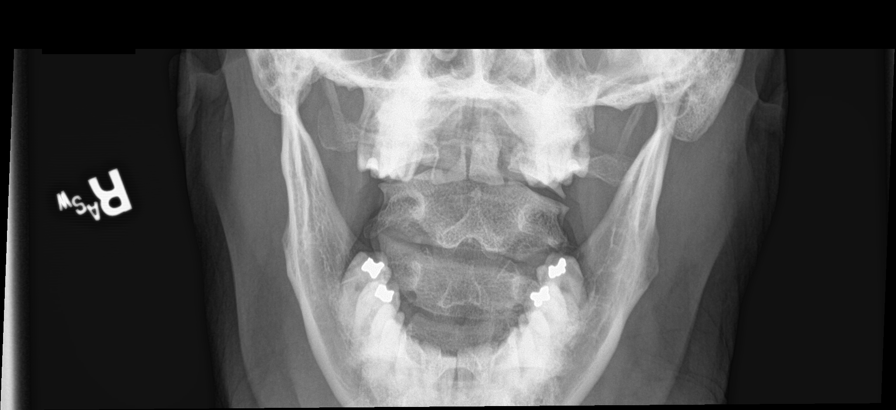

[[person_name]]
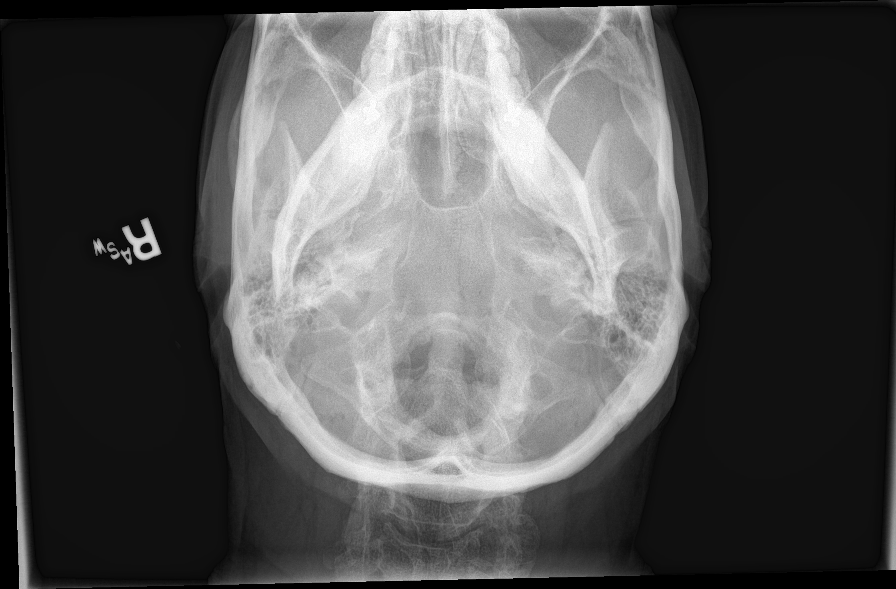

[ct-spine swimmers]
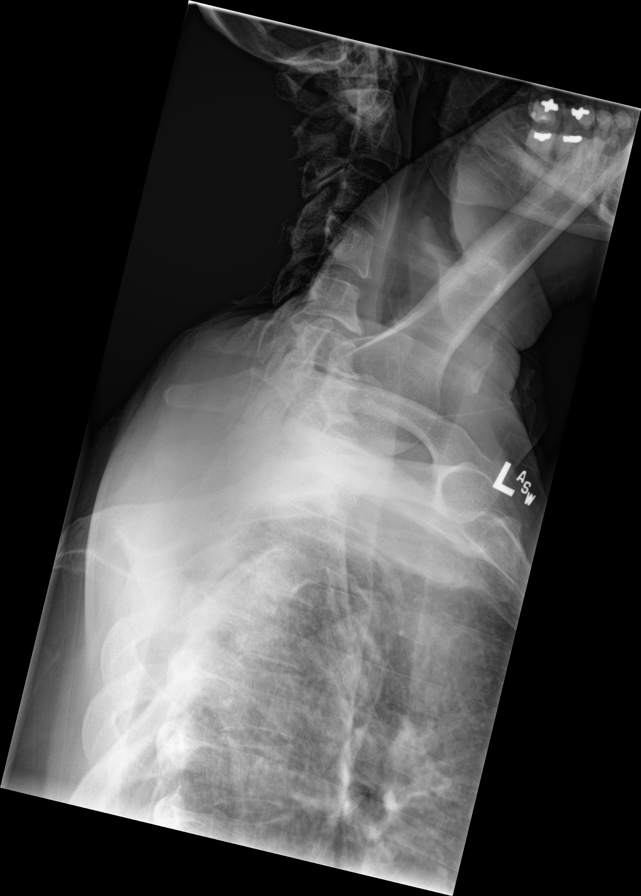

[c-spine open mouth (2 of 2)]
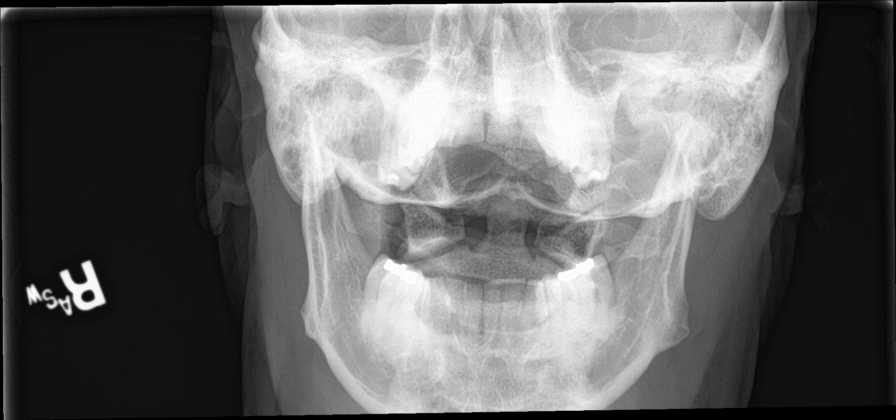

[8 of 8 positions shown; findings below may reference images not displayed]

FINDINGS: Alignment is normal. No soft tissue swelling. Chronic appearing
degenerative spondylosis at C5-6, C6-7 and C7-T1 with disc space
narrowing and endplate osteophytes. Facet osteoarthritis on the left
at C3-4 more than C2-3 and C4-5. Bony foraminal narrowing on the
left at C3-4 and C6-7, and to a lesser extent at C5-6 and C7-T1.
Foramina on the right appear widely patent except for mild bony
encroachment at C5-6, C6-7 and C7-T1.
IMPRESSION: Degenerative cervical spondylosis of facet arthropathy, apparently
worse on the left than the right by radiography. See above.

## 2018-08-10 ENCOUNTER — Encounter: Payer: Self-pay | Admitting: Emergency Medicine

## 2018-08-10 ENCOUNTER — Emergency Department
Admission: EM | Admit: 2018-08-10 | Discharge: 2018-08-10 | Disposition: A | Discharge status: Home / Self Care | Payer: Managed Care, Other (non HMO) | Attending: Emergency Medicine | Admitting: Emergency Medicine

## 2018-08-10 ENCOUNTER — Other Ambulatory Visit: Payer: Self-pay

## 2018-08-10 DIAGNOSIS — M5416 Radiculopathy, lumbar region: Secondary | ICD-10-CM | POA: Insufficient documentation

## 2018-08-10 DIAGNOSIS — M545 Low back pain: Secondary | ICD-10-CM | POA: Diagnosis present

## 2018-08-10 DIAGNOSIS — F17210 Nicotine dependence, cigarettes, uncomplicated: Secondary | ICD-10-CM | POA: Insufficient documentation

## 2018-08-10 MED ORDER — CYCLOBENZAPRINE HCL 5 MG PO TABS: 5.0000 mg | ORAL_TABLET | Freq: Three times a day (TID) | ORAL | 0 refills | Status: AC | PRN

## 2018-08-10 MED ORDER — HYDROCODONE-ACETAMINOPHEN 5-325 MG PO TABS: 1.0000 | ORAL_TABLET | Freq: Four times a day (QID) | ORAL | 0 refills | Status: AC | PRN

## 2018-08-10 MED ORDER — PREDNISONE 10 MG PO TABS: ORAL_TABLET | ORAL | 0 refills | Status: AC

## 2018-08-10 MED ORDER — HYDROMORPHONE HCL 1 MG/ML IJ SOLN
1.0000 mg | Freq: Once | INTRAMUSCULAR | Status: AC
  Administered 2018-08-10: 1 mg via INTRAMUSCULAR
  Filled 2018-08-10: qty 1

## 2018-08-10 MED ORDER — DEXAMETHASONE SODIUM PHOSPHATE 10 MG/ML IJ SOLN
10.0000 mg | Freq: Once | INTRAMUSCULAR | Status: AC
  Administered 2018-08-10: 10 mg via INTRAMUSCULAR
  Filled 2018-08-10: qty 1

## 2018-08-10 NOTE — ED Notes (Addendum)
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 To "shut up" and There are people here in pain and you all need to stop being assholes". Advised pt that we were laughing because we enjoy are jobs - he stated we just needed to stop and help people in pain". Loud, yelling the whole time.

## 2018-08-10 NOTE — ED Triage Notes (Signed)
Pt arrives via POV from home with complaints of lower back pain across the entire l-spine and radiates down right leg. Trouble sitting still. 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

## 2018-08-10 NOTE — ED Provider Notes (Signed)
[facility] EMERGENCY DEPARTMENT Provider Note   CSN: [id number] Arrival date & time: 08/10/18  1403     History   Chief Complaint Chief Complaint  Patient presents with  . Back Pain    HPI Joel Mendez is a 52 y.o. male presents to the emergency department for evaluation of acute on chronic right lower back pain radiating down his right leg.  Patient states for 4 to 5 months he has had pain in his right buttocks going down his posterior thigh and calf.  Pain is been constant and increases with movement.  He describes shooting burning pain with numbness and tingling.  Patient's pain has been tolerable with Lyrica and ibuprofen over the last 4 to 5 months but over the last few days pain is been severe.  Has a hard time standing due to severe pain.  He is able to ambulate with a cane.  He denies any new trauma or injury.  No loss of bowel or bladder symptoms.  He denies any abdominal pain, fevers.  He has not seen an orthopedist or spine specialist for this issue.  He denies any history of MRIs.  HPI  History reviewed. No pertinent past medical history.  There are no active problems to display for this patient.   Past Surgical History:  Procedure Laterality Date  . cyst removal          Home Medications    Prior to Admission medications   Medication Sig Start Date End Date Taking? Authorizing Provider  cyclobenzaprine (FLEXERIL) 5 MG tablet Take 1-2 tablets (5-10 mg total) by mouth 3 (three) times daily as needed for muscle spasms. 08/10/18   Evon Slack, PA-C  HYDROcodone-acetaminophen (NORCO) 5-325 MG tablet Take 1 tablet by mouth every 6 (six) hours as needed for moderate pain. 08/10/18   Evon Slack, PA-C  meloxicam (MOBIC) 15 MG tablet Take 1 tablet (15 mg total) daily by mouth. Start after finishing prednisone pack 10/12/17   Triplett, Cari B, FNP  orphenadrine (NORFLEX) 100 MG tablet Take 1 tablet (100 mg total) by mouth 2 (two) times daily.  04/30/17   Hassan Rowan, MD  predniSONE (DELTASONE) 10 MG tablet 10 day taper. 5,5,4,4,3,3,2,2,1,1 08/10/18   Evon Slack, PA-C  pregabalin (LYRICA) 25 MG capsule Take 1 capsule (25 mg total) 2 (two) times daily by mouth. 10/12/17   Triplett, Kasandra Knudsen, FNP    Family History No family history on file.  Social History Social History   Tobacco Use  . Smoking status: Current Every Day Smoker    Packs/day: 1.00    Types: Cigarettes  . Smokeless tobacco: Never Used  Substance Use Topics  . Alcohol use: Yes  . Drug use: No     Allergies   Patient has no known allergies.   Review of Systems Review of Systems  Constitutional: Negative for fatigue and fever.  Respiratory: Negative for shortness of breath.   Cardiovascular: Negative for chest pain.  Gastrointestinal: Negative for abdominal pain.  Musculoskeletal: Positive for back pain, gait problem and myalgias. Negative for joint swelling, neck pain and neck stiffness.  Skin: Negative for rash and wound.  Neurological: Positive for dizziness and numbness. Negative for light-headedness and headaches.     Physical Exam Updated Vital Signs BP 100/75 (BP Location: Left Arm)   Pulse 88   Temp 97.8 F (36.6 C) (Oral)   Resp 18   Ht 6' (1.829 m)   Wt 88.5 kg  SpO2 96%   BMI 26.45 kg/m   Physical Exam  Constitutional: He is oriented to person, place, and time. He appears well-developed and well-nourished.  HENT:  Head: Normocephalic and atraumatic.  Eyes: Conjunctivae are normal.  Neck: Normal range of motion.  Cardiovascular: Normal rate.  Pulmonary/Chest: Effort normal. No respiratory distress.  Abdominal: Soft. He exhibits no distension. There is no tenderness. There is no guarding.  Musculoskeletal:  Lumbar Spine: Examination of the lumbar spine reveals no bony abnormality, no edema, and no ecchymosis.  There is no step off.  The patient has pain with lumbar flexion.  No pain with lumbar extension..  The patient is  non tender along the spinous process.  The patient is non tender along the paravertebral muscles, with no muscle spasms.  The patient is non tender along the iliac crest.  The patient is non tender in the sciatic notch.  The patient is non tender along the Sacroiliac joint.  There is no Coccyx joint tenderness.    Bilateral Lower Extremities: Examination of the lower extremities reveals no bony abnormality, no edema, and no ecchymosis.  The patient has full active and passive range of motion of the hips, knees, and ankles.  There is no discomfort with range of motion exercises.  The patient is non tender along the greater trochanter region.  The patient has a negative Denna Haggard' test bilaterally.  There is normal skin warmth.  There is normal capillary refill bilaterally.    Neurologic: The patient has a positive right straight leg raise.  The patient has normal muscle strength testing for the quadriceps, calves, ankle dorsiflexion, ankle plantarflexion, and extensor hallicus longus.  The patient has sensation that is intact to light touch.     Neurological: He is alert and oriented to person, place, and time.  Skin: Skin is warm. No rash noted.  Psychiatric: He has a normal mood and affect. His behavior is normal. Thought content normal.     ED Treatments / Results  Labs (all labs ordered are listed, but only abnormal results are displayed) Labs Reviewed - No data to display  EKG None  Radiology No results found.  Procedures Procedures (including critical care time)  Medications Ordered in ED Medications  HYDROmorphone (DILAUDID) injection 1 mg (1 mg Intramuscular Given 08/10/18 1600)  dexamethasone (DECADRON) injection 10 mg (10 mg Intramuscular Given 08/10/18 1559)     Initial Impression / Assessment and Plan / ED Course  I have reviewed the triage vital signs and the nursing notes.  Pertinent labs & imaging results that were available during my care of the patient were reviewed by  me and considered in my medical decision making (see chart for details).     52 year old male with acute on chronic right lumbar radiculopathy, S1 nerve distribution with no gross weakness or neurological deficits.  He is given IM pain medicine and anti-inflammatory medication here in the emergency department also improvement with pain as well as with ambulation.  He was sent home with Norco, Flexeril, prednisone taper.  He is encouraged to follow-up with orthopedics as this is turning into a chronic issue.  Patient will call orthopedic office Monday morning to schedule follow-up appointment.  He understands signs symptoms return to ED for.  Final Clinical Impressions(s) / ED Diagnoses   Final diagnoses:  Right lumbar radiculopathy    ED Discharge Orders         Ordered    HYDROcodone-acetaminophen (NORCO) 5-325 MG tablet  Every 6 hours PRN  08/10/18 1553    predniSONE (DELTASONE) 10 MG tablet     08/10/18 1553    cyclobenzaprine (FLEXERIL) 5 MG tablet  3 times daily PRN     08/10/18 1553           Ronnette Juniper 08/10/18 1621    Emily Filbert, MD 08/10/18 1740

## 2018-08-10 NOTE — Discharge Instructions (Signed)
Please call orthopedic provider tomorrow to schedule follow-up appointment.  Please take medications as prescribed.  Do not operate any heavy machinery while taking these medications.  Return to the emergency department for any increasing pain worsening symptoms or to changes in your health such as weakness loss of bowel bladder symptoms.

## 2018-08-10 NOTE — ED Notes (Signed)
BPD at bedside 

## 2018-08-10 NOTE — ED Notes (Signed)
See triage note  Presents with lower back pain which is moving into right leg  States pain started about 4 months ago  But became worse over the weekend  Unable to bear full wt

## 2019-05-23 ENCOUNTER — Emergency Department
Admission: EM | Admit: 2019-05-23 | Discharge: 2019-05-23 | Disposition: A | Discharge status: Home / Self Care | Payer: Managed Care, Other (non HMO) | Attending: Emergency Medicine | Admitting: Emergency Medicine

## 2019-05-23 ENCOUNTER — Encounter: Payer: Self-pay | Admitting: Emergency Medicine

## 2019-05-23 ENCOUNTER — Other Ambulatory Visit: Payer: Self-pay

## 2019-05-23 DIAGNOSIS — L0231 Cutaneous abscess of buttock: Secondary | ICD-10-CM | POA: Insufficient documentation

## 2019-05-23 DIAGNOSIS — F17210 Nicotine dependence, cigarettes, uncomplicated: Secondary | ICD-10-CM | POA: Insufficient documentation

## 2019-05-23 DIAGNOSIS — L0291 Cutaneous abscess, unspecified: Secondary | ICD-10-CM | POA: Diagnosis present

## 2019-05-23 MED ORDER — SULFAMETHOXAZOLE-TRIMETHOPRIM 800-160 MG PO TABS: 1.0000 | ORAL_TABLET | Freq: Two times a day (BID) | ORAL | 0 refills | Status: AC

## 2019-05-23 MED ORDER — OXYCODONE-ACETAMINOPHEN 7.5-325 MG PO TABS: 1.0000 | ORAL_TABLET | Freq: Four times a day (QID) | ORAL | 0 refills | Status: DC | PRN

## 2019-05-23 MED ORDER — LIDOCAINE HCL (PF) 1 % IJ SOLN
5.0000 mL | Freq: Once | INTRAMUSCULAR | Status: AC
  Administered 2019-05-23: 13:00:00 5 mL
  Filled 2019-05-23: qty 5

## 2019-05-23 MED ORDER — LIDOCAINE HCL (PF) 1 % IJ SOLN
INTRAMUSCULAR | Status: AC
  Administered 2019-05-23: 5 mL
  Filled 2019-05-23: qty 5

## 2019-05-23 MED ORDER — OXYCODONE-ACETAMINOPHEN 7.5-325 MG PO TABS: 1.0000 | ORAL_TABLET | Freq: Four times a day (QID) | ORAL | 0 refills | Status: AC | PRN

## 2019-05-23 MED ORDER — LIDOCAINE HCL (PF) 1 % IJ SOLN
5.0000 mL | Freq: Once | INTRAMUSCULAR | Status: AC
  Administered 2019-05-23: 13:00:00 5 mL

## 2019-05-23 NOTE — ED Provider Notes (Signed)
[facility] Emergency Department Provider Note   ____________________________________________   First MD Initiated Contact with Patient 05/23/19 1139     (approximate)  I have reviewed the triage vital signs and the nursing notes.   HISTORY  Chief Complaint Abscess    HPI Joel Mendez is a 53 y.o. male patient presents with abscess to the left buttocks for 1 week.  Patient is a lesion has not grown in size in the past 3 days.  Patient pain is worse today.  Patient denies any drainage.  Patient denies fever associated this complaint.  Patient rates pain as a 7/10.  Patient described pain as "pressure/aching".  No palliative measure for complaint.         History reviewed. No pertinent past medical history.  There are no active problems to display for this patient.   Past Surgical History:  Procedure Laterality Date  . cyst removal      Prior to Admission medications   Medication Sig Start Date End Date Taking? Authorizing Provider  cyclobenzaprine (FLEXERIL) 5 MG tablet Take 1-2 tablets (5-10 mg total) by mouth 3 (three) times daily as needed for muscle spasms. 08/10/18   Evon SlackGaines, Thomas C, PA-C  HYDROcodone-acetaminophen (NORCO) 5-325 MG tablet Take 1 tablet by mouth every 6 (six) hours as needed for moderate pain. 08/10/18   Evon SlackGaines, Thomas C, PA-C  meloxicam (MOBIC) 15 MG tablet Take 1 tablet (15 mg total) daily by mouth. Start after finishing prednisone pack 10/12/17   Triplett, Cari B, FNP  orphenadrine (NORFLEX) 100 MG tablet Take 1 tablet (100 mg total) by mouth 2 (two) times daily. 04/30/17   Hassan RowanWade, Eugene, MD  oxyCODONE-acetaminophen (PERCOCET) 7.5-325 MG tablet Take 1 tablet by mouth every 6 (six) hours as needed. 05/23/19   Joni ReiningSmith, Evadna Donaghy K, PA-C  predniSONE (DELTASONE) 10 MG tablet 10 day taper. 5,5,4,4,3,3,2,2,1,1 08/10/18   Evon SlackGaines, Thomas C, PA-C  pregabalin (LYRICA) 25 MG capsule Take 1 capsule (25 mg total) 2 (two) times daily by mouth.  10/12/17   Triplett, Cari B, FNP  sulfamethoxazole-trimethoprim (BACTRIM DS) 800-160 MG tablet Take 1 tablet by mouth 2 (two) times daily. 05/23/19   Joni ReiningSmith, Brynn Reznik K, PA-C    Allergies Patient has no known allergies.  No family history on file.  Social History Social History   Tobacco Use  . Smoking status: Current Every Day Smoker    Packs/day: 1.00    Types: Cigarettes  . Smokeless tobacco: Never Used  Substance Use Topics  . Alcohol use: Yes  . Drug use: No    Review of Systems Constitutional: No fever/chills Eyes: No visual changes. ENT: No sore throat. Cardiovascular: Denies chest pain. Respiratory: Denies shortness of breath. Gastrointestinal: No abdominal pain.  No nausea, no vomiting.  No diarrhea.  No constipation. Genitourinary: Negative for dysuria. Musculoskeletal: Negative for back pain. Skin: Negative for rash.  Nodule lesion left buttocks. Neurological: Negative for headaches, focal weakness or numbness.   ____________________________________________   PHYSICAL EXAM:  VITAL SIGNS: ED Triage Vitals  Enc Vitals Group     BP 05/23/19 1021 (!) 155/95     Pulse Rate 05/23/19 1021 76     Resp 05/23/19 1021 16     Temp 05/23/19 1021 98.9 F (37.2 C)     Temp Source 05/23/19 1021 Oral     SpO2 05/23/19 1021 92 %     Weight 05/23/19 1021 210 lb (95.3 kg)     Height 05/23/19 1021 6' (1.829 m)  Head Circumference --      Peak Flow --      Pain Score 05/23/19 1023 7     Pain Loc --      Pain Edu? --      Excl. in GC? --     Constitutional: Alert and oriented. Well appearing and in no acute distress. Cardiovascular: Normal rate, regular rhythm. Grossly normal heart sounds.  Good peripheral circulation. Respiratory: Normal respiratory effort.  No retractions. Lungs CTAB. Neurologic:  Normal speech and language. No gross focal neurologic deficits are appreciated. No gait instability. Skin:  Skin is warm, dry and intact. No rash noted.  Erythematous  nodular lesion which is fluctuant left buttocks. Psychiatric: Mood and affect are normal. Speech and behavior are normal.  ____________________________________________   LABS (all labs ordered are listed, but only abnormal results are displayed)  Labs Reviewed - No data to display ____________________________________________  EKG   ____________________________________________  RADIOLOGY  ED MD interpretation:    Official radiology report(s): No results found.  ____________________________________________   PROCEDURES  Procedure(s) performed (including Critical Care):  Marland Kitchen.Marland Kitchen.Incision and Drainage  Date/Time: 05/23/2019 12:58 PM Performed by: Joni ReiningSmith, Ross Hefferan K, PA-C Authorized by: Joni ReiningSmith, Riven Mabile K, PA-C   Consent:    Consent obtained:  Verbal   Consent given by:  Patient   Risks discussed:  Bleeding, incomplete drainage and pain Location:    Type:  Abscess   Location:  Lower extremity   Lower extremity location:  Buttock   Buttock location:  L buttock Pre-procedure details:    Skin preparation:  Betadine Anesthesia (see MAR for exact dosages):    Anesthesia method:  Local infiltration   Local anesthetic:  Lidocaine 1% w/o epi Procedure type:    Complexity:  Simple Procedure details:    Incision types:  Single with marsupialization   Scalpel blade:  11   Wound management:  Probed and deloculated   Drainage amount:  Moderate   Wound treatment:  Drain placed and wound left open   Packing materials:  1/4 in iodoform gauze   Amount 1/4" iodoform:  5 Post-procedure details:    Patient tolerance of procedure:  Tolerated well, no immediate complications     ____________________________________________   INITIAL IMPRESSION / ASSESSMENT AND PLAN / ED COURSE  As part of my medical decision making, I reviewed the following data within the electronic MEDICAL RECORD NUMBER         Patient presents with abscess to the left buttocks.  Discussed with patient rationale for  incision and drainage.  Patient gave consent for procedure.  Patient given discharge care instruction advised take medication as directed.  Patient return in 2 days for wound check.      ____________________________________________   FINAL CLINICAL IMPRESSION(S) / ED DIAGNOSES  Final diagnoses:  Abscess of buttock, right     ED Discharge Orders         Ordered    sulfamethoxazole-trimethoprim (BACTRIM DS) 800-160 MG tablet  2 times daily     05/23/19 1256    oxyCODONE-acetaminophen (PERCOCET) 7.5-325 MG tablet  Every 6 hours PRN,   Status:  Discontinued     05/23/19 1256    oxyCODONE-acetaminophen (PERCOCET) 7.5-325 MG tablet  Every 6 hours PRN     05/23/19 1257           Note:  This document was prepared using Dragon voice recognition software and may include unintentional dictation errors.    Joni ReiningSmith, Defne Gerling K, PA-C 05/23/19 1302  Nena Polio, MD 05/23/19 [phone]

## 2019-05-23 NOTE — ED Triage Notes (Signed)
Pt c/o boil on his left posterior upper leg. Pt states that it has been there for the past 3 days but worse today. Pt is in NAD.

## 2019-08-04 ENCOUNTER — Encounter: Payer: Self-pay | Admitting: Emergency Medicine

## 2019-08-04 ENCOUNTER — Other Ambulatory Visit: Payer: Self-pay

## 2019-08-04 ENCOUNTER — Emergency Department
Admission: EM | Admit: 2019-08-04 | Discharge: 2019-08-04 | Disposition: A | Discharge status: Home / Self Care | Payer: Managed Care, Other (non HMO) | Attending: Emergency Medicine | Admitting: Emergency Medicine

## 2019-08-04 DIAGNOSIS — Z5321 Procedure and treatment not carried out due to patient leaving prior to being seen by health care provider: Secondary | ICD-10-CM | POA: Insufficient documentation

## 2019-08-04 DIAGNOSIS — M79672 Pain in left foot: Secondary | ICD-10-CM | POA: Insufficient documentation

## 2019-08-04 LAB — CBC WITH DIFFERENTIAL/PLATELET
Abs Immature Granulocytes: 0.03 10*3/uL (ref 0.00–0.07)
Basophils Absolute: 0.1 10*3/uL (ref 0.0–0.1)
Basophils Relative: 1 %
Eosinophils Absolute: 0.2 10*3/uL (ref 0.0–0.5)
Eosinophils Relative: 2 %
HCT: 46.4 % (ref 39.0–52.0)
Hemoglobin: 16.2 g/dL (ref 13.0–17.0)
Immature Granulocytes: 0 %
Lymphocytes Relative: 22 %
Lymphs Abs: 2.2 10*3/uL (ref 0.7–4.0)
MCH: 33.9 pg (ref 26.0–34.0)
MCHC: 34.9 g/dL (ref 30.0–36.0)
MCV: 97.1 fL (ref 80.0–100.0)
Monocytes Absolute: 1 10*3/uL (ref 0.1–1.0)
Monocytes Relative: 10 %
Neutro Abs: 6.6 10*3/uL (ref 1.7–7.7)
Neutrophils Relative %: 65 %
Platelets: 286 10*3/uL (ref 150–400)
RBC: 4.78 MIL/uL (ref 4.22–5.81)
RDW: 12.9 % (ref 11.5–15.5)
WBC: 10.2 10*3/uL (ref 4.0–10.5)
nRBC: 0 % (ref 0.0–0.2)

## 2019-08-04 LAB — COMPREHENSIVE METABOLIC PANEL
ALT: 31 U/L (ref 0–44)
AST: 22 U/L (ref 15–41)
Albumin: 4 g/dL (ref 3.5–5.0)
Alkaline Phosphatase: 103 U/L (ref 38–126)
Anion gap: 11 (ref 5–15)
BUN: 11 mg/dL (ref 6–20)
CO2: 23 mmol/L (ref 22–32)
Calcium: 8.7 mg/dL — ABNORMAL LOW (ref 8.9–10.3)
Chloride: 102 mmol/L (ref 98–111)
Creatinine, Ser: 0.8 mg/dL (ref 0.61–1.24)
GFR calc Af Amer: >60 mL/min (ref >60)
GFR calc non Af Amer: >60 mL/min (ref >60)
Glucose, Bld: 183 mg/dL — ABNORMAL HIGH (ref 70–99)
Potassium: 3.4 mmol/L — ABNORMAL LOW (ref 3.5–5.1)
Sodium: 136 mmol/L (ref 135–145)
Total Bilirubin: 0.4 mg/dL (ref 0.3–1.2)
Total Protein: 7.3 g/dL (ref 6.5–8.1)

## 2019-08-04 LAB — LACTIC ACID, PLASMA: Lactic Acid, Venous: 1.9 mmol/L (ref 0.5–1.9)

## 2019-08-04 NOTE — ED Triage Notes (Signed)
Patient states he has had nagging pain on left fifth toe x1 week. States yesterday, pain became significantly worse after standing in his boots at work all day yesterday. Patient has swelling and redness to left foot and sore noted between fourth and fifth toes. Patient states he leaves his boots on the porch and thinks he may have gotten a spider bite. No drainage noted. Denies fever at home.   Patient also reports he was seen for a boil on top of left buttock about a month ago and it has not completely healed.

## 2019-08-05 ENCOUNTER — Emergency Department
Admission: EM | Admit: 2019-08-05 | Discharge: 2019-08-05 | Disposition: A | Discharge status: Home / Self Care | Payer: Managed Care, Other (non HMO) | Attending: Emergency Medicine | Admitting: Emergency Medicine

## 2019-08-05 ENCOUNTER — Encounter: Payer: Self-pay | Admitting: Emergency Medicine

## 2019-08-05 ENCOUNTER — Other Ambulatory Visit: Payer: Self-pay

## 2019-08-05 DIAGNOSIS — F17210 Nicotine dependence, cigarettes, uncomplicated: Secondary | ICD-10-CM | POA: Diagnosis not present

## 2019-08-05 DIAGNOSIS — Z79899 Other long term (current) drug therapy: Secondary | ICD-10-CM | POA: Insufficient documentation

## 2019-08-05 DIAGNOSIS — M79672 Pain in left foot: Secondary | ICD-10-CM | POA: Diagnosis present

## 2019-08-05 DIAGNOSIS — L03116 Cellulitis of left lower limb: Secondary | ICD-10-CM | POA: Diagnosis not present

## 2019-08-05 MED ORDER — KETOROLAC TROMETHAMINE 30 MG/ML IJ SOLN
30.0000 mg | Freq: Once | INTRAMUSCULAR | Status: AC
  Administered 2019-08-05: 12:00:00 30 mg via INTRAMUSCULAR
  Filled 2019-08-05: qty 1

## 2019-08-05 MED ORDER — NAPROXEN 500 MG PO TABS: 500.0000 mg | ORAL_TABLET | Freq: Two times a day (BID) | ORAL | 2 refills | Status: AC

## 2019-08-05 MED ORDER — CLOTRIMAZOLE 1 % EX OINT: 1.0000 [in_us] | TOPICAL_OINTMENT | Freq: Three times a day (TID) | CUTANEOUS | 0 refills | Status: DC

## 2019-08-05 MED ORDER — CEPHALEXIN 500 MG PO CAPS
500.0000 mg | ORAL_CAPSULE | Freq: Once | ORAL | Status: AC
  Administered 2019-08-05: 500 mg via ORAL
  Filled 2019-08-05: qty 1

## 2019-08-05 MED ORDER — CEPHALEXIN 500 MG PO CAPS: 500.0000 mg | ORAL_CAPSULE | Freq: Three times a day (TID) | ORAL | 0 refills | Status: AC

## 2019-08-05 MED ORDER — CEPHALEXIN 500 MG PO CAPS: 500.0000 mg | ORAL_CAPSULE | Freq: Three times a day (TID) | ORAL | 0 refills | Status: DC

## 2019-08-05 MED ORDER — CLOTRIMAZOLE 1 % EX OINT: 1.0000 [in_us] | TOPICAL_OINTMENT | Freq: Three times a day (TID) | CUTANEOUS | 0 refills | Status: AC

## 2019-08-05 MED ORDER — NAPROXEN 500 MG PO TABS: 500.0000 mg | ORAL_TABLET | Freq: Two times a day (BID) | ORAL | 2 refills | Status: DC

## 2019-08-05 NOTE — ED Triage Notes (Signed)
Patient presents to ED via POV from home with c/o left foot pain. Patient reports concerns for spider bite. Reports he noticed redness, edema and pain last Thursday. Discoloration noted top of foot near pinky toe. Entire foot is red and swollen. Positive pulses and sensation.

## 2019-08-05 NOTE — ED Provider Notes (Signed)
[facility] Emergency Department Provider Note   ____________________________________________    I have reviewed the triage vital signs and the nursing notes.   HISTORY  Chief Complaint Foot Pain     HPI Joel Mendez is a 53 y.o. male who presents with complaints of left foot pain.  Patient describes he developed a small area of redness at the base of his fourth and fifth toes which was painful several days ago.  He reports over the last several days has developed worsening redness and swelling to his left foot.  He reports significant pain while wearing shoes.  Denies fevers or chills.  He is not diabetic.  Denies injury to the area.  No history of similar.  Not take anything for this  History reviewed. No pertinent past medical history.  There are no active problems to display for this patient.   Past Surgical History:  Procedure Laterality Date  . cyst removal      Prior to Admission medications   Medication Sig Start Date End Date Taking? Authorizing Provider  cephALEXin (KEFLEX) 500 MG capsule Take 1 capsule (500 mg total) by mouth 3 (three) times daily for 7 days. 08/05/19 08/12/19  Lavonia Drafts, MD  Clotrimazole 1 % OINT Apply 1 inch topically 3 (three) times daily for 7 days. 08/05/19 08/12/19  Lavonia Drafts, MD  cyclobenzaprine (FLEXERIL) 5 MG tablet Take 1-2 tablets (5-10 mg total) by mouth 3 (three) times daily as needed for muscle spasms. 08/10/18   Duanne Guess, PA-C  HYDROcodone-acetaminophen (NORCO) 5-325 MG tablet Take 1 tablet by mouth every 6 (six) hours as needed for moderate pain. 08/10/18   Duanne Guess, PA-C  meloxicam (MOBIC) 15 MG tablet Take 1 tablet (15 mg total) daily by mouth. Start after finishing prednisone pack 10/12/17   Triplett, Cari B, FNP  naproxen (NAPROSYN) 500 MG tablet Take 1 tablet (500 mg total) by mouth 2 (two) times daily with a meal. 08/05/19   Lavonia Drafts, MD  orphenadrine (NORFLEX) 100 MG tablet Take 1  tablet (100 mg total) by mouth 2 (two) times daily. 04/30/17   Frederich Cha, MD  oxyCODONE-acetaminophen (PERCOCET) 7.5-325 MG tablet Take 1 tablet by mouth every 6 (six) hours as needed. 05/23/19   Sable Feil, PA-C  predniSONE (DELTASONE) 10 MG tablet 10 day taper. 5,5,4,4,3,3,2,2,1,1 08/10/18   Duanne Guess, PA-C  pregabalin (LYRICA) 25 MG capsule Take 1 capsule (25 mg total) 2 (two) times daily by mouth. 10/12/17   Triplett, Cari B, FNP  sulfamethoxazole-trimethoprim (BACTRIM DS) 800-160 MG tablet Take 1 tablet by mouth 2 (two) times daily. 05/23/19   Sable Feil, PA-C     Allergies Patient has no known allergies.  No family history on file.  Social History Social History   Tobacco Use  . Smoking status: Current Every Day Smoker    Packs/day: 1.00    Types: Cigarettes  . Smokeless tobacco: Never Used  Substance Use Topics  . Alcohol use: Yes  . Drug use: No    Review of Systems  Constitutional: No fevers Eyes: No visual changes.  ENT: No sore throat. Cardiovascular: Denies chest pain. Respiratory: Denies shortness of breath. Gastrointestinal: No abdominal pain.  No nausea, no vomiting.   Genitourinary: Negative for dysuria. Musculoskeletal: As above Skin: As above Neurological: Negative for headaches or weakness   ____________________________________________   PHYSICAL EXAM:  VITAL SIGNS: ED Triage Vitals [08/05/19 1131]  Enc Vitals Group  BP (!) 185/104     Pulse Rate 99     Resp 20     Temp 98 F (36.7 C)     Temp Source Oral     SpO2 97 %     Weight 95.3 kg (210 lb)     Height 1.829 m (6')     Head Circumference      Peak Flow      Pain Score 8     Pain Loc      Pain Edu?      Excl. in GC?     Constitutional: Alert and oriented.   Nose: No congestion/rhinnorhea. Mouth/Throat: Mucous membranes are moist.   Neck:  Painless ROM Cardiovascular: Normal rate, regular rhythm.  Good peripheral circulation.  Foot is warm and well-perfused  Respiratory: Normal respiratory effort.  No retractions. Lungs CTAB. Gastrointestinal: Soft and nontender. No distention.  No CVA tenderness. Genitourinary: deferred Musculoskeletal: No lower extremity tenderness nor edema.  Warm and well perfused Neurologic:  Normal speech and language. No gross focal neurologic deficits are appreciated.  Skin:  Skin is warm, dry.  Patient with notable fungal infection between the left fourth and fifth toes, just proximal to this is where the densest erythema is which is only about 2 x 2 cm, no fluctuance.  Extending from that area is mild erythema and swelling.  No streaking.  See picture Psychiatric: Mood and affect are normal. Speech and behavior are normal.  ____________________________________________   LABS (all labs ordered are listed, but only abnormal results are displayed)  Labs Reviewed - No data to display ____________________________________________  EKG  None____________________________________________  RADIOLOGY   ____________________________________________   PROCEDURES  Procedure(s) performed: No  Procedures   Critical Care performed: No ____________________________________________   INITIAL IMPRESSION / ASSESSMENT AND PLAN / ED COURSE  Pertinent labs & imaging results that were available during my care of the patient were reviewed by me and considered in my medical decision making (see chart for details).  Suspect fungal infection led to skin break causing bacterial cellulitis.  No evidence of fluctuance abscess or purulent discharge.  Will treat with Keflex p.o., strict return precautions if no improvement    ____________________________________________   FINAL CLINICAL IMPRESSION(S) / ED DIAGNOSES  Final diagnoses:  Cellulitis of left lower extremity        Note:  This document was prepared using Dragon voice recognition software and may include unintentional dictation errors.   Jene EveryKinner, Cameren Odwyer, MD  08/05/19 1228
